# Patient Record
Sex: Male | Born: 1935 | Race: White | Hispanic: No | Marital: Married | Smoking: Former smoker
Health system: Southern US, Community
[De-identification: ages and names within clinical notes are randomized; demographics above are authoritative.]

## PROBLEM LIST (undated history)

## (undated) DIAGNOSIS — N138 Other obstructive and reflux uropathy: Secondary | ICD-10-CM

## (undated) DIAGNOSIS — G454 Transient global amnesia: Secondary | ICD-10-CM

## (undated) DIAGNOSIS — F09 Unspecified mental disorder due to known physiological condition: Secondary | ICD-10-CM

## (undated) DIAGNOSIS — G459 Transient cerebral ischemic attack, unspecified: Secondary | ICD-10-CM

## (undated) DIAGNOSIS — M48062 Spinal stenosis, lumbar region with neurogenic claudication: Secondary | ICD-10-CM

## (undated) DIAGNOSIS — R972 Elevated prostate specific antigen [PSA]: Secondary | ICD-10-CM

## (undated) DIAGNOSIS — I2510 Atherosclerotic heart disease of native coronary artery without angina pectoris: Secondary | ICD-10-CM

## (undated) DIAGNOSIS — C4492 Squamous cell carcinoma of skin, unspecified: Secondary | ICD-10-CM

## (undated) DIAGNOSIS — I10 Essential (primary) hypertension: Secondary | ICD-10-CM

## (undated) DIAGNOSIS — R351 Nocturia: Secondary | ICD-10-CM

## (undated) DIAGNOSIS — M48061 Spinal stenosis, lumbar region without neurogenic claudication: Secondary | ICD-10-CM

## (undated) DIAGNOSIS — C3491 Malignant neoplasm of unspecified part of right bronchus or lung: Secondary | ICD-10-CM

## (undated) DIAGNOSIS — N401 Enlarged prostate with lower urinary tract symptoms: Secondary | ICD-10-CM

## (undated) DIAGNOSIS — M1990 Unspecified osteoarthritis, unspecified site: Secondary | ICD-10-CM

## (undated) DIAGNOSIS — Z85118 Personal history of other malignant neoplasm of bronchus and lung: Secondary | ICD-10-CM

## (undated) DIAGNOSIS — E785 Hyperlipidemia, unspecified: Secondary | ICD-10-CM

## (undated) DIAGNOSIS — E7800 Pure hypercholesterolemia, unspecified: Secondary | ICD-10-CM

## (undated) DIAGNOSIS — N400 Enlarged prostate without lower urinary tract symptoms: Secondary | ICD-10-CM

## (undated) DIAGNOSIS — J300 Vasomotor rhinitis: Secondary | ICD-10-CM

## (undated) DIAGNOSIS — Z974 Presence of external hearing-aid: Secondary | ICD-10-CM

## (undated) HISTORY — DX: Unspecified mental disorder due to known physiological condition: F09

## (undated) HISTORY — DX: Other obstructive and reflux uropathy: N13.8

## (undated) HISTORY — PX: EPIDURAL BLOCK INJECTION: SHX1516

## (undated) HISTORY — DX: Transient cerebral ischemic attack, unspecified: G45.9

## (undated) HISTORY — PX: ROTATOR CUFF REPAIR: SHX139

## (undated) HISTORY — DX: Spinal stenosis, lumbar region with neurogenic claudication: M48.062

## (undated) HISTORY — DX: Spinal stenosis, lumbar region without neurogenic claudication: M48.061

## (undated) HISTORY — DX: Vasomotor rhinitis: J30.0

## (undated) HISTORY — DX: Transient global amnesia: G45.4

## (undated) HISTORY — DX: Benign prostatic hyperplasia with lower urinary tract symptoms: N40.1

## (undated) HISTORY — DX: Elevated prostate specific antigen (PSA): R97.20

## (undated) HISTORY — DX: Pure hypercholesterolemia, unspecified: E78.00

---
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
## 1999-10-14 DIAGNOSIS — Z85118 Personal history of other malignant neoplasm of bronchus and lung: Secondary | ICD-10-CM

## 1999-10-14 HISTORY — DX: Personal history of other malignant neoplasm of bronchus and lung: Z85.118

## 1999-10-14 HISTORY — PX: LUNG CANCER SURGERY: SHX702

## 2004-07-15 ENCOUNTER — Ambulatory Visit: Payer: Self-pay | Admitting: Oncology

## 2004-08-13 ENCOUNTER — Ambulatory Visit: Payer: Self-pay | Admitting: Oncology

## 2005-01-07 ENCOUNTER — Ambulatory Visit: Payer: Self-pay | Admitting: Oncology

## 2005-01-13 ENCOUNTER — Ambulatory Visit: Payer: Self-pay | Admitting: Oncology

## 2005-07-14 ENCOUNTER — Ambulatory Visit: Payer: Self-pay | Admitting: Oncology

## 2005-08-13 ENCOUNTER — Ambulatory Visit: Payer: Self-pay | Admitting: Oncology

## 2006-01-06 ENCOUNTER — Ambulatory Visit: Payer: Self-pay | Admitting: Oncology

## 2006-01-09 ENCOUNTER — Ambulatory Visit: Payer: Self-pay | Admitting: Oncology

## 2006-06-23 ENCOUNTER — Ambulatory Visit: Payer: Self-pay | Admitting: Oncology

## 2006-06-24 ENCOUNTER — Ambulatory Visit: Payer: Self-pay | Admitting: Oncology

## 2006-07-13 ENCOUNTER — Ambulatory Visit: Payer: Self-pay | Admitting: Oncology

## 2006-08-13 ENCOUNTER — Ambulatory Visit: Payer: Self-pay | Admitting: Oncology

## 2006-12-18 ENCOUNTER — Ambulatory Visit: Payer: Self-pay | Admitting: Internal Medicine

## 2006-12-29 ENCOUNTER — Ambulatory Visit: Payer: Self-pay | Admitting: Internal Medicine

## 2006-12-29 ENCOUNTER — Ambulatory Visit: Payer: Self-pay | Admitting: Oncology

## 2007-01-12 ENCOUNTER — Ambulatory Visit: Payer: Self-pay | Admitting: Internal Medicine

## 2007-06-09 ENCOUNTER — Ambulatory Visit: Payer: Self-pay | Admitting: Internal Medicine

## 2007-07-14 ENCOUNTER — Ambulatory Visit: Payer: Self-pay | Admitting: Internal Medicine

## 2007-07-22 ENCOUNTER — Ambulatory Visit: Payer: Self-pay | Admitting: Internal Medicine

## 2007-08-14 ENCOUNTER — Ambulatory Visit: Payer: Self-pay | Admitting: Internal Medicine

## 2007-10-14 ENCOUNTER — Ambulatory Visit: Payer: Self-pay | Admitting: Oncology

## 2007-12-12 ENCOUNTER — Ambulatory Visit: Payer: Self-pay | Admitting: Internal Medicine

## 2008-01-03 ENCOUNTER — Ambulatory Visit: Payer: Self-pay | Admitting: Internal Medicine

## 2008-01-12 ENCOUNTER — Ambulatory Visit: Payer: Self-pay | Admitting: Internal Medicine

## 2008-02-11 ENCOUNTER — Ambulatory Visit: Payer: Self-pay | Admitting: Internal Medicine

## 2008-04-12 ENCOUNTER — Ambulatory Visit: Payer: Self-pay | Admitting: Internal Medicine

## 2008-05-02 ENCOUNTER — Ambulatory Visit: Payer: Self-pay | Admitting: Internal Medicine

## 2008-05-04 ENCOUNTER — Ambulatory Visit: Payer: Self-pay | Admitting: Internal Medicine

## 2008-05-13 ENCOUNTER — Ambulatory Visit: Payer: Self-pay | Admitting: Internal Medicine

## 2008-07-13 ENCOUNTER — Ambulatory Visit: Payer: Self-pay | Admitting: Internal Medicine

## 2008-08-01 ENCOUNTER — Ambulatory Visit: Payer: Self-pay | Admitting: Internal Medicine

## 2008-08-03 ENCOUNTER — Ambulatory Visit: Payer: Self-pay | Admitting: Internal Medicine

## 2008-08-13 ENCOUNTER — Ambulatory Visit: Payer: Self-pay | Admitting: Internal Medicine

## 2008-10-13 ENCOUNTER — Ambulatory Visit: Payer: Self-pay | Admitting: Internal Medicine

## 2008-11-08 ENCOUNTER — Ambulatory Visit: Payer: Self-pay | Admitting: Internal Medicine

## 2008-11-10 ENCOUNTER — Ambulatory Visit: Payer: Self-pay | Admitting: Internal Medicine

## 2008-11-13 ENCOUNTER — Ambulatory Visit: Payer: Self-pay | Admitting: Internal Medicine

## 2009-02-10 ENCOUNTER — Ambulatory Visit: Payer: Self-pay | Admitting: Internal Medicine

## 2009-02-13 ENCOUNTER — Ambulatory Visit: Payer: Self-pay | Admitting: Internal Medicine

## 2009-02-15 ENCOUNTER — Ambulatory Visit: Payer: Self-pay | Admitting: Internal Medicine

## 2009-03-13 ENCOUNTER — Ambulatory Visit: Payer: Self-pay | Admitting: Internal Medicine

## 2009-06-27 ENCOUNTER — Ambulatory Visit: Payer: Self-pay | Admitting: Orthopedic Surgery

## 2009-07-11 ENCOUNTER — Ambulatory Visit: Payer: Self-pay | Admitting: Ophthalmology

## 2009-07-18 ENCOUNTER — Ambulatory Visit: Payer: Self-pay | Admitting: Ophthalmology

## 2009-08-27 ENCOUNTER — Ambulatory Visit: Payer: Self-pay | Admitting: Family Medicine

## 2009-08-29 ENCOUNTER — Ambulatory Visit: Payer: Self-pay | Admitting: Internal Medicine

## 2009-09-12 ENCOUNTER — Ambulatory Visit: Payer: Self-pay | Admitting: Internal Medicine

## 2010-02-10 ENCOUNTER — Ambulatory Visit: Payer: Self-pay | Admitting: Internal Medicine

## 2010-02-22 ENCOUNTER — Ambulatory Visit: Payer: Self-pay | Admitting: Internal Medicine

## 2010-02-26 ENCOUNTER — Ambulatory Visit: Payer: Self-pay | Admitting: Internal Medicine

## 2010-02-28 ENCOUNTER — Ambulatory Visit: Payer: Self-pay | Admitting: Otolaryngology

## 2010-03-13 ENCOUNTER — Ambulatory Visit: Payer: Self-pay | Admitting: Internal Medicine

## 2010-04-12 ENCOUNTER — Ambulatory Visit: Payer: Self-pay | Admitting: Internal Medicine

## 2010-08-26 ENCOUNTER — Ambulatory Visit: Payer: Self-pay | Admitting: Internal Medicine

## 2010-08-29 ENCOUNTER — Ambulatory Visit: Payer: Self-pay | Admitting: Internal Medicine

## 2010-09-12 ENCOUNTER — Ambulatory Visit: Payer: Self-pay | Admitting: Internal Medicine

## 2011-08-21 ENCOUNTER — Ambulatory Visit: Payer: Self-pay | Admitting: Internal Medicine

## 2011-08-29 ENCOUNTER — Ambulatory Visit: Payer: Self-pay | Admitting: Internal Medicine

## 2011-09-13 ENCOUNTER — Ambulatory Visit: Payer: Self-pay | Admitting: Internal Medicine

## 2011-09-22 ENCOUNTER — Ambulatory Visit: Payer: Self-pay | Admitting: Internal Medicine

## 2012-02-25 ENCOUNTER — Ambulatory Visit: Payer: Self-pay | Admitting: Internal Medicine

## 2012-03-01 ENCOUNTER — Ambulatory Visit: Payer: Self-pay | Admitting: Oncology

## 2012-03-01 LAB — COMPREHENSIVE METABOLIC PANEL
Anion Gap: 10 (ref 7–16)
BUN: 17 mg/dL (ref 7–18)
Chloride: 100 mmol/L (ref 98–107)
Co2: 30 mmol/L (ref 21–32)
Creatinine: 1.12 mg/dL (ref 0.60–1.30)
EGFR (African American): >60
EGFR (Non-African Amer.): >60
Osmolality: 282 (ref 275–301)

## 2012-03-01 LAB — CBC CANCER CENTER
HCT: 45.4 % (ref 40.0–52.0)
HGB: 14.9 g/dL (ref 13.0–18.0)
Lymphocyte %: 24 %
MCH: 28.2 pg (ref 26.0–34.0)
MCHC: 32.9 g/dL (ref 32.0–36.0)
MCV: 86 fL (ref 80–100)
Monocyte %: 11.9 %
Platelet: 213 x10 3/mm (ref 150–440)
RBC: 5.3 10*6/uL (ref 4.40–5.90)
RDW: 15.3 % — ABNORMAL HIGH (ref 11.5–14.5)
WBC: 7.4 x10 3/mm (ref 3.8–10.6)

## 2012-03-13 ENCOUNTER — Ambulatory Visit: Payer: Self-pay | Admitting: Oncology

## 2012-07-05 DIAGNOSIS — R972 Elevated prostate specific antigen [PSA]: Secondary | ICD-10-CM | POA: Insufficient documentation

## 2012-07-05 HISTORY — DX: Elevated prostate specific antigen (PSA): R97.20

## 2012-08-25 ENCOUNTER — Ambulatory Visit: Payer: Self-pay | Admitting: Oncology

## 2012-09-12 ENCOUNTER — Ambulatory Visit: Payer: Self-pay | Admitting: Oncology

## 2012-10-08 ENCOUNTER — Ambulatory Visit: Payer: Self-pay | Admitting: Internal Medicine

## 2012-11-06 DIAGNOSIS — N138 Other obstructive and reflux uropathy: Secondary | ICD-10-CM

## 2012-11-06 DIAGNOSIS — N401 Enlarged prostate with lower urinary tract symptoms: Secondary | ICD-10-CM | POA: Insufficient documentation

## 2012-11-06 HISTORY — DX: Other obstructive and reflux uropathy: N13.8

## 2013-01-05 ENCOUNTER — Other Ambulatory Visit: Payer: Self-pay | Admitting: Neurosurgery

## 2013-01-11 ENCOUNTER — Encounter (HOSPITAL_COMMUNITY): Payer: Self-pay | Admitting: Pharmacy Technician

## 2013-01-12 NOTE — Pre-Procedure Instructions (Signed)
Roberto Adams  01/12/2013   Your procedure is scheduled on:  Monday, April 7  Report to [facility] at 0700 AM.  Call this number if you have problems the morning of surgery: [phone]   Remember:   Do not eat food or drink liquids after midnight.Sunday night   Take these medicines the morning of surgery with A SIP OF WATER: none   Do not wear jewelry, make-up or nail polish.  Do not wear lotions, powders, or perfumes. You may wear deodorant.  Do not shave 48 hours prior to surgery. Men may shave face and neck.  Do not bring valuables to the hospital.  Contacts, dentures or bridgework may not be worn into surgery.  Leave suitcase in the car. After surgery it may be brought to your room.  For patients admitted to the hospital, checkout time is 11:00 AM the day of  discharge.   Special Instructions: Shower using CHG 2 nights before surgery and the night before surgery.  If you shower the day of surgery use CHG.  Use special wash - you have one bottle of CHG for all showers.  You should use approximately 1/3 of the bottle for each shower.   Please read over the following fact sheets that you were given: Pain Booklet, Coughing and Deep Breathing and Surgical Site Infection Prevention

## 2013-01-13 ENCOUNTER — Encounter (HOSPITAL_COMMUNITY): Payer: Self-pay

## 2013-01-13 ENCOUNTER — Encounter (HOSPITAL_COMMUNITY)
Admission: RE | Admit: 2013-01-13 | Discharge: 2013-01-13 | Disposition: A | Discharge status: Home / Self Care | Payer: Medicare Other | Source: Ambulatory Visit | Attending: Neurosurgery | Admitting: Neurosurgery

## 2013-01-13 ENCOUNTER — Ambulatory Visit (HOSPITAL_COMMUNITY)
Admission: RE | Admit: 2013-01-13 | Discharge: 2013-01-13 | Disposition: A | Discharge status: Home / Self Care | Payer: Medicare Other | Source: Ambulatory Visit | Attending: Anesthesiology | Admitting: Anesthesiology

## 2013-01-13 DIAGNOSIS — Z01810 Encounter for preprocedural cardiovascular examination: Secondary | ICD-10-CM | POA: Insufficient documentation

## 2013-01-13 DIAGNOSIS — Z85118 Personal history of other malignant neoplasm of bronchus and lung: Secondary | ICD-10-CM | POA: Insufficient documentation

## 2013-01-13 DIAGNOSIS — I10 Essential (primary) hypertension: Secondary | ICD-10-CM | POA: Insufficient documentation

## 2013-01-13 DIAGNOSIS — Z01812 Encounter for preprocedural laboratory examination: Secondary | ICD-10-CM | POA: Insufficient documentation

## 2013-01-13 DIAGNOSIS — Z01818 Encounter for other preprocedural examination: Secondary | ICD-10-CM | POA: Insufficient documentation

## 2013-01-13 DIAGNOSIS — N400 Enlarged prostate without lower urinary tract symptoms: Secondary | ICD-10-CM | POA: Insufficient documentation

## 2013-01-13 DIAGNOSIS — Z87891 Personal history of nicotine dependence: Secondary | ICD-10-CM | POA: Insufficient documentation

## 2013-01-13 DIAGNOSIS — E669 Obesity, unspecified: Secondary | ICD-10-CM | POA: Insufficient documentation

## 2013-01-13 HISTORY — DX: Nocturia: R35.1

## 2013-01-13 HISTORY — DX: Benign prostatic hyperplasia with lower urinary tract symptoms: N40.1

## 2013-01-13 HISTORY — DX: Personal history of other malignant neoplasm of bronchus and lung: Z85.118

## 2013-01-13 HISTORY — DX: Essential (primary) hypertension: I10

## 2013-01-13 HISTORY — DX: Unspecified osteoarthritis, unspecified site: M19.90

## 2013-01-13 HISTORY — DX: Presence of external hearing-aid: Z97.4

## 2013-01-13 HISTORY — DX: Benign prostatic hyperplasia without lower urinary tract symptoms: N40.0

## 2013-01-13 LAB — BASIC METABOLIC PANEL
CO2: 27 mEq/L (ref 19–32)
Calcium: 9.6 mg/dL (ref 8.4–10.5)
Chloride: 100 mEq/L (ref 96–112)
Glucose, Bld: 101 mg/dL — ABNORMAL HIGH (ref 70–99)
Sodium: 137 mEq/L (ref 135–145)

## 2013-01-13 LAB — CBC WITH DIFFERENTIAL/PLATELET
Eosinophils Absolute: 0.2 10*3/uL (ref 0.0–0.7)
Eosinophils Relative: 2 % (ref 0–5)
HCT: 44.2 % (ref 39.0–52.0)
Lymphocytes Relative: 24 % (ref 12–46)
Lymphs Abs: 2.1 10*3/uL (ref 0.7–4.0)
MCH: 28.8 pg (ref 26.0–34.0)
MCV: 82.6 fL (ref 78.0–100.0)
Monocytes Absolute: 0.7 10*3/uL (ref 0.1–1.0)
Monocytes Relative: 9 % (ref 3–12)
RBC: 5.35 MIL/uL (ref 4.22–5.81)
WBC: 8.6 10*3/uL (ref 4.0–10.5)

## 2013-01-13 LAB — SURGICAL PCR SCREEN: Staphylococcus aureus: NEGATIVE

## 2013-01-13 NOTE — Progress Notes (Signed)
Stop-Bang tool faxed to PCP ; pt score 4

## 2013-01-13 NOTE — Progress Notes (Signed)
EKG requested from PCP, Dr Dareen Piano @ [facility] in [place]

## 2013-01-14 NOTE — Progress Notes (Addendum)
Anesthesia Chart Review:  Patient is a 78 year old male scheduled for right L2-3, L3-4, L4-5 decompressive lumbar laminectomy on 01/17/13 by Dr. Jordan Likes.  History includes obesity, former smoker, hypertension, BPH, hearing aids, RUL lung cancer s/p surgical resection and chemoradiation '01. His OSA screening score was 4 or greater. PCP is Dr. Dareen Piano at [facility].  EKG on 01/13/13 showed NSR, right BBB.  It appears stable when compared to his EKG on 07/11/09 from his PCP.  CXR report on 01/13/13 showed: Cardiomediastinal silhouette is unremarkable. Mild elevation of the right hemidiaphragm. Degenerative changes lower thoracic spine. No acute infiltrate or pulmonary edema. Old fracture of the right clavicle. Postsurgical changes left humeral head.   Preoperative labs noted.  He will be evaluated by his assigned anesthesiologist on the day of surgery to discuss the definitive anesthesia plan.  Velna Ochs [facility]/Anesthesiology Phone [phone] 01/14/2013 9:55 AM

## 2013-01-16 MED ORDER — CEFAZOLIN SODIUM-DEXTROSE 2-3 GM-% IV SOLR
2.0000 g | INTRAVENOUS | Status: AC
  Administered 2013-01-17: 2 g via INTRAVENOUS
  Filled 2013-01-16: qty 50

## 2013-01-16 MED ORDER — DEXAMETHASONE SODIUM PHOSPHATE 10 MG/ML IJ SOLN
10.0000 mg | INTRAMUSCULAR | Status: AC
  Administered 2013-01-17: 10 mg via INTRAVENOUS
  Filled 2013-01-16: qty 1

## 2013-01-17 ENCOUNTER — Inpatient Hospital Stay (HOSPITAL_COMMUNITY): Payer: Medicare Other | Admitting: Anesthesiology

## 2013-01-17 ENCOUNTER — Observation Stay (HOSPITAL_COMMUNITY)
Admission: RE | Admit: 2013-01-17 | Discharge: 2013-01-18 | Disposition: A | Discharge status: Home / Self Care | Payer: Medicare Other | Source: Ambulatory Visit | Attending: Neurosurgery | Admitting: Neurosurgery

## 2013-01-17 ENCOUNTER — Inpatient Hospital Stay (HOSPITAL_COMMUNITY): Payer: Medicare Other

## 2013-01-17 ENCOUNTER — Encounter (HOSPITAL_COMMUNITY)
Admission: RE | Disposition: A | Discharge status: Home / Self Care | Payer: Self-pay | Source: Ambulatory Visit | Attending: Neurosurgery

## 2013-01-17 ENCOUNTER — Encounter (HOSPITAL_COMMUNITY): Payer: Self-pay | Admitting: Surgery

## 2013-01-17 ENCOUNTER — Encounter (HOSPITAL_COMMUNITY): Payer: Self-pay | Admitting: Vascular Surgery

## 2013-01-17 DIAGNOSIS — M48062 Spinal stenosis, lumbar region with neurogenic claudication: Secondary | ICD-10-CM

## 2013-01-17 DIAGNOSIS — M47817 Spondylosis without myelopathy or radiculopathy, lumbosacral region: Principal | ICD-10-CM | POA: Insufficient documentation

## 2013-01-17 DIAGNOSIS — M48061 Spinal stenosis, lumbar region without neurogenic claudication: Secondary | ICD-10-CM | POA: Insufficient documentation

## 2013-01-17 DIAGNOSIS — I10 Essential (primary) hypertension: Secondary | ICD-10-CM | POA: Insufficient documentation

## 2013-01-17 HISTORY — PX: LUMBAR LAMINECTOMY/DECOMPRESSION MICRODISCECTOMY: SHX5026

## 2013-01-17 HISTORY — DX: Spinal stenosis, lumbar region without neurogenic claudication: M48.061

## 2013-01-17 HISTORY — DX: Spinal stenosis, lumbar region with neurogenic claudication: M48.062

## 2013-01-17 SURGERY — LUMBAR LAMINECTOMY/DECOMPRESSION MICRODISCECTOMY 2 LEVELS
Anesthesia: General | Site: Spine Lumbar | Laterality: Right | Wound class: Clean

## 2013-01-17 MED ORDER — ONDANSETRON HCL 4 MG/2ML IJ SOLN: 4.0000 mg | Freq: Once | INTRAMUSCULAR | Status: DC | PRN

## 2013-01-17 MED ORDER — 0.9 % SODIUM CHLORIDE (POUR BTL) OPTIME
TOPICAL | Status: DC | PRN
  Administered 2013-01-17: 1000 mL

## 2013-01-17 MED ORDER — HYDROCODONE-ACETAMINOPHEN 5-325 MG PO TABS: 1.0000 | ORAL_TABLET | ORAL | Status: DC | PRN

## 2013-01-17 MED ORDER — EPHEDRINE SULFATE 50 MG/ML IJ SOLN
INTRAMUSCULAR | Status: DC | PRN
  Administered 2013-01-17 (×2): 5 mg via INTRAVENOUS

## 2013-01-17 MED ORDER — HYDROMORPHONE HCL PF 1 MG/ML IJ SOLN
INTRAMUSCULAR | Status: AC
  Filled 2013-01-17: qty 1

## 2013-01-17 MED ORDER — HEMOSTATIC AGENTS (NO CHARGE) OPTIME
TOPICAL | Status: DC | PRN
  Administered 2013-01-17: 1 via TOPICAL

## 2013-01-17 MED ORDER — ASPIRIN EC 81 MG PO TBEC
81.0000 mg | DELAYED_RELEASE_TABLET | Freq: Every day | ORAL | Status: DC
  Administered 2013-01-17: 81 mg via ORAL
  Filled 2013-01-17 (×2): qty 1

## 2013-01-17 MED ORDER — BACITRACIN 50000 UNITS IM SOLR
INTRAMUSCULAR | Status: AC
  Filled 2013-01-17: qty 1

## 2013-01-17 MED ORDER — HYDROCHLOROTHIAZIDE 25 MG PO TABS
25.0000 mg | ORAL_TABLET | Freq: Every day | ORAL | Status: DC
  Filled 2013-01-17: qty 1

## 2013-01-17 MED ORDER — CEFAZOLIN SODIUM 1-5 GM-% IV SOLN
1.0000 g | Freq: Three times a day (TID) | INTRAVENOUS | Status: AC
  Administered 2013-01-17 – 2013-01-18 (×2): 1 g via INTRAVENOUS
  Filled 2013-01-17 (×2): qty 50

## 2013-01-17 MED ORDER — PNEUMOCOCCAL VAC POLYVALENT 25 MCG/0.5ML IJ INJ
0.5000 mL | INJECTION | INTRAMUSCULAR | Status: DC
Start: 2013-01-18 — End: 2013-01-18
  Filled 2013-01-17: qty 0.5

## 2013-01-17 MED ORDER — SENNA 8.6 MG PO TABS
1.0000 | ORAL_TABLET | Freq: Two times a day (BID) | ORAL | Status: DC
  Administered 2013-01-17: 8.6 mg via ORAL
  Filled 2013-01-17 (×3): qty 1

## 2013-01-17 MED ORDER — ADULT MULTIVITAMIN W/MINERALS CH
1.0000 | ORAL_TABLET | Freq: Every day | ORAL | Status: DC
  Filled 2013-01-17: qty 1

## 2013-01-17 MED ORDER — ALUM & MAG HYDROXIDE-SIMETH 200-200-20 MG/5ML PO SUSP: 30.0000 mL | Freq: Four times a day (QID) | ORAL | Status: DC | PRN

## 2013-01-17 MED ORDER — SODIUM CHLORIDE 0.9 % IJ SOLN: 3.0000 mL | INTRAMUSCULAR | Status: DC | PRN

## 2013-01-17 MED ORDER — ONDANSETRON HCL 4 MG/2ML IJ SOLN: 4.0000 mg | INTRAMUSCULAR | Status: DC | PRN

## 2013-01-17 MED ORDER — NEOSTIGMINE METHYLSULFATE 1 MG/ML IJ SOLN
INTRAMUSCULAR | Status: DC | PRN
  Administered 2013-01-17: 3 mg via INTRAVENOUS

## 2013-01-17 MED ORDER — ONDANSETRON HCL 4 MG/2ML IJ SOLN
INTRAMUSCULAR | Status: DC | PRN
  Administered 2013-01-17: 4 mg via INTRAVENOUS

## 2013-01-17 MED ORDER — SODIUM CHLORIDE 0.9 % IJ SOLN
3.0000 mL | Freq: Two times a day (BID) | INTRAMUSCULAR | Status: DC
  Administered 2013-01-17: 3 mL via INTRAVENOUS

## 2013-01-17 MED ORDER — ROCURONIUM BROMIDE 100 MG/10ML IV SOLN
INTRAVENOUS | Status: DC | PRN
  Administered 2013-01-17: 50 mg via INTRAVENOUS

## 2013-01-17 MED ORDER — PROPOFOL 10 MG/ML IV BOLUS
INTRAVENOUS | Status: DC | PRN
  Administered 2013-01-17: 20 mg via INTRAVENOUS

## 2013-01-17 MED ORDER — MIDAZOLAM HCL 5 MG/5ML IJ SOLN
INTRAMUSCULAR | Status: DC | PRN
  Administered 2013-01-17: 2 mg via INTRAVENOUS

## 2013-01-17 MED ORDER — GLYCOPYRROLATE 0.2 MG/ML IJ SOLN
INTRAMUSCULAR | Status: DC | PRN
  Administered 2013-01-17: .5 mg via INTRAVENOUS

## 2013-01-17 MED ORDER — HYDROMORPHONE HCL PF 1 MG/ML IJ SOLN: 0.2500 mg | INTRAMUSCULAR | Status: DC | PRN

## 2013-01-17 MED ORDER — HYDROMORPHONE HCL PF 1 MG/ML IJ SOLN
0.5000 mg | INTRAMUSCULAR | Status: DC | PRN
  Administered 2013-01-17: 1 mg via INTRAVENOUS

## 2013-01-17 MED ORDER — BUPIVACAINE HCL (PF) 0.25 % IJ SOLN
INTRAMUSCULAR | Status: DC | PRN
  Administered 2013-01-17: 10 mL

## 2013-01-17 MED ORDER — KETOROLAC TROMETHAMINE 30 MG/ML IJ SOLN
30.0000 mg | Freq: Four times a day (QID) | INTRAMUSCULAR | Status: DC
  Administered 2013-01-17 – 2013-01-18 (×3): 30 mg via INTRAVENOUS
  Filled 2013-01-17 (×7): qty 1

## 2013-01-17 MED ORDER — OXYCODONE-ACETAMINOPHEN 5-325 MG PO TABS: 1.0000 | ORAL_TABLET | ORAL | Status: DC | PRN

## 2013-01-17 MED ORDER — SODIUM CHLORIDE 0.9 % IV SOLN: 250.0000 mL | INTRAVENOUS | Status: DC

## 2013-01-17 MED ORDER — FENTANYL CITRATE 0.05 MG/ML IJ SOLN
INTRAMUSCULAR | Status: DC | PRN
  Administered 2013-01-17: 150 ug via INTRAVENOUS
  Administered 2013-01-17: 50 ug via INTRAVENOUS

## 2013-01-17 MED ORDER — THROMBIN 5000 UNITS EX SOLR
CUTANEOUS | Status: DC | PRN
  Administered 2013-01-17 (×2): 5000 [IU] via TOPICAL

## 2013-01-17 MED ORDER — ACETAMINOPHEN 650 MG RE SUPP: 650.0000 mg | RECTAL | Status: DC | PRN

## 2013-01-17 MED ORDER — PHENOL 1.4 % MT LIQD: 1.0000 | OROMUCOSAL | Status: DC | PRN

## 2013-01-17 MED ORDER — LIDOCAINE HCL (CARDIAC) 20 MG/ML IV SOLN
INTRAVENOUS | Status: DC | PRN
  Administered 2013-01-17: 100 mg via INTRAVENOUS

## 2013-01-17 MED ORDER — KETOROLAC TROMETHAMINE 30 MG/ML IJ SOLN
30.0000 mg | Freq: Once | INTRAMUSCULAR | Status: AC
  Administered 2013-01-17: 30 mg via INTRAVENOUS

## 2013-01-17 MED ORDER — LISINOPRIL 40 MG PO TABS
40.0000 mg | ORAL_TABLET | Freq: Every day | ORAL | Status: DC
  Filled 2013-01-17: qty 1

## 2013-01-17 MED ORDER — LACTATED RINGERS IV SOLN
INTRAVENOUS | Status: DC | PRN
  Administered 2013-01-17 (×3): via INTRAVENOUS

## 2013-01-17 MED ORDER — ACETAMINOPHEN 325 MG PO TABS: 650.0000 mg | ORAL_TABLET | ORAL | Status: DC | PRN

## 2013-01-17 MED ORDER — SODIUM CHLORIDE 0.9 % IR SOLN
Status: DC | PRN
  Administered 2013-01-17: 12:00:00

## 2013-01-17 MED ORDER — SODIUM CHLORIDE 0.9 % IV SOLN
INTRAVENOUS | Status: AC
  Filled 2013-01-17: qty 500

## 2013-01-17 MED ORDER — PHENYLEPHRINE HCL 10 MG/ML IJ SOLN
INTRAMUSCULAR | Status: DC | PRN
  Administered 2013-01-17: 40 ug via INTRAVENOUS
  Administered 2013-01-17: 80 ug via INTRAVENOUS
  Administered 2013-01-17: 40 ug via INTRAVENOUS
  Administered 2013-01-17 (×2): 80 ug via INTRAVENOUS

## 2013-01-17 MED ORDER — MENTHOL 3 MG MT LOZG: 1.0000 | LOZENGE | OROMUCOSAL | Status: DC | PRN

## 2013-01-17 MED ORDER — ASPIRIN 81 MG PO TABS: 81.0000 mg | ORAL_TABLET | Freq: Every day | ORAL | Status: DC

## 2013-01-17 MED ORDER — ZOLPIDEM TARTRATE 5 MG PO TABS: 5.0000 mg | ORAL_TABLET | Freq: Every evening | ORAL | Status: DC | PRN

## 2013-01-17 MED ORDER — CYCLOBENZAPRINE HCL 10 MG PO TABS: 10.0000 mg | ORAL_TABLET | Freq: Three times a day (TID) | ORAL | Status: DC | PRN

## 2013-01-17 SURGICAL SUPPLY — 60 items
BAG DECANTER FOR FLEXI CONT (MISCELLANEOUS) ×2 IMPLANT
BENZOIN TINCTURE PRP APPL 2/3 (GAUZE/BANDAGES/DRESSINGS) ×2 IMPLANT
BLADE SURG ROTATE 9660 (MISCELLANEOUS) ×2 IMPLANT
BRUSH SCRUB EZ PLAIN DRY (MISCELLANEOUS) ×2 IMPLANT
BUR CUTTER 7.0 ROUND (BURR) ×2 IMPLANT
CANISTER SUCTION 2500CC (MISCELLANEOUS) ×2 IMPLANT
CLOTH BEACON ORANGE TIMEOUT ST (SAFETY) ×2 IMPLANT
CONT SPEC 4OZ CLIKSEAL STRL BL (MISCELLANEOUS) ×2 IMPLANT
DECANTER SPIKE VIAL GLASS SM (MISCELLANEOUS) IMPLANT
DERMABOND ADHESIVE PROPEN (GAUZE/BANDAGES/DRESSINGS) ×1
DERMABOND ADVANCED (GAUZE/BANDAGES/DRESSINGS) ×1
DERMABOND ADVANCED .7 DNX12 (GAUZE/BANDAGES/DRESSINGS) ×1 IMPLANT
DERMABOND ADVANCED .7 DNX6 (GAUZE/BANDAGES/DRESSINGS) ×1 IMPLANT
DRAPE LAPAROTOMY 100X72X124 (DRAPES) ×2 IMPLANT
DRAPE MICROSCOPE ZEISS OPMI (DRAPES) IMPLANT
DRAPE POUCH INSTRU U-SHP 10X18 (DRAPES) ×2 IMPLANT
DRAPE PROXIMA HALF (DRAPES) IMPLANT
DRAPE SURG 17X23 STRL (DRAPES) ×4 IMPLANT
DURAPREP 26ML APPLICATOR (WOUND CARE) ×2 IMPLANT
ELECT REM PT RETURN 9FT ADLT (ELECTROSURGICAL) ×2
ELECTRODE REM PT RTRN 9FT ADLT (ELECTROSURGICAL) ×1 IMPLANT
EVACUATOR 1/8 PVC DRAIN (DRAIN) ×2 IMPLANT
GAUZE SPONGE 4X4 16PLY XRAY LF (GAUZE/BANDAGES/DRESSINGS) IMPLANT
GLOVE BIO SURGEON STRL SZ8.5 (GLOVE) ×2 IMPLANT
GLOVE BIOGEL PI IND STRL 7.0 (GLOVE) ×2 IMPLANT
GLOVE BIOGEL PI IND STRL 7.5 (GLOVE) ×1 IMPLANT
GLOVE BIOGEL PI INDICATOR 7.0 (GLOVE) ×2
GLOVE BIOGEL PI INDICATOR 7.5 (GLOVE) ×1
GLOVE ECLIPSE 7.5 STRL STRAW (GLOVE) ×4 IMPLANT
GLOVE ECLIPSE 8.5 STRL (GLOVE) ×2 IMPLANT
GLOVE EXAM NITRILE LRG STRL (GLOVE) IMPLANT
GLOVE EXAM NITRILE MD LF STRL (GLOVE) ×2 IMPLANT
GLOVE EXAM NITRILE XL STR (GLOVE) IMPLANT
GLOVE EXAM NITRILE XS STR PU (GLOVE) IMPLANT
GLOVE SS BIOGEL STRL SZ 6.5 (GLOVE) ×2 IMPLANT
GLOVE SS BIOGEL STRL SZ 8 (GLOVE) ×1 IMPLANT
GLOVE SUPERSENSE BIOGEL SZ 6.5 (GLOVE) ×2
GLOVE SUPERSENSE BIOGEL SZ 8 (GLOVE) ×1
GOWN BRE IMP SLV AUR LG STRL (GOWN DISPOSABLE) ×2 IMPLANT
GOWN BRE IMP SLV AUR XL STRL (GOWN DISPOSABLE) ×6 IMPLANT
GOWN STRL REIN 2XL LVL4 (GOWN DISPOSABLE) IMPLANT
KIT BASIN OR (CUSTOM PROCEDURE TRAY) ×2 IMPLANT
KIT ROOM TURNOVER OR (KITS) ×2 IMPLANT
NEEDLE HYPO 22GX1.5 SAFETY (NEEDLE) ×2 IMPLANT
NEEDLE SPNL 22GX3.5 QUINCKE BK (NEEDLE) ×2 IMPLANT
NS IRRIG 1000ML POUR BTL (IV SOLUTION) ×2 IMPLANT
PACK LAMINECTOMY NEURO (CUSTOM PROCEDURE TRAY) ×2 IMPLANT
PAD ARMBOARD 7.5X6 YLW CONV (MISCELLANEOUS) ×10 IMPLANT
RUBBERBAND STERILE (MISCELLANEOUS) ×4 IMPLANT
SPONGE GAUZE 4X4 12PLY (GAUZE/BANDAGES/DRESSINGS) ×2 IMPLANT
SPONGE SURGIFOAM ABS GEL SZ50 (HEMOSTASIS) ×2 IMPLANT
STRIP CLOSURE SKIN 1/2X4 (GAUZE/BANDAGES/DRESSINGS) ×2 IMPLANT
SUT VIC AB 2-0 CT1 18 (SUTURE) ×2 IMPLANT
SUT VIC AB 3-0 SH 8-18 (SUTURE) ×2 IMPLANT
SYR 20ML ECCENTRIC (SYRINGE) ×2 IMPLANT
TAPE CLOTH SURG 4X10 WHT LF (GAUZE/BANDAGES/DRESSINGS) ×2 IMPLANT
TAPE STRIPS DRAPE STRL (GAUZE/BANDAGES/DRESSINGS) ×2 IMPLANT
TOWEL OR 17X24 6PK STRL BLUE (TOWEL DISPOSABLE) ×2 IMPLANT
TOWEL OR 17X26 10 PK STRL BLUE (TOWEL DISPOSABLE) ×2 IMPLANT
WATER STERILE IRR 1000ML POUR (IV SOLUTION) ×2 IMPLANT

## 2013-01-17 NOTE — Anesthesia Preprocedure Evaluation (Signed)
Anesthesia Evaluation  Patient identified by MRN, date of birth, ID band Patient awake    Reviewed: Allergy & Precautions, H&P , NPO status , Patient's Chart, lab work & pertinent test results  Airway Mallampati: I TM Distance: >3 FB Neck ROM: full    Dental   Pulmonary former smoker,          Cardiovascular hypertension, Pt. on medications Rhythm:regular Rate:Normal     Neuro/Psych    GI/Hepatic   Endo/Other    Renal/GU      Musculoskeletal   Abdominal   Peds  Hematology   Anesthesia Other Findings Hx Ca Lung  Rx surgery/chemo/radiation- HOH  Reproductive/Obstetrics                           Anesthesia Physical Anesthesia Plan  ASA: II  Anesthesia Plan: General   Post-op Pain Management:    Induction: Intravenous  Airway Management Planned: Oral ETT  Additional Equipment:   Intra-op Plan:   Post-operative Plan: Extubation in OR  Informed Consent: I have reviewed the patients History and Physical, chart, labs and discussed the procedure including the risks, benefits and alternatives for the proposed anesthesia with the patient or authorized representative who has indicated his/her understanding and acceptance.     Plan Discussed with: CRNA, Anesthesiologist and Surgeon  Anesthesia Plan Comments:         Anesthesia Quick Evaluation

## 2013-01-17 NOTE — Preoperative (Signed)
Beta Blockers   Reason not to administer Beta Blockers:Not Applicable 

## 2013-01-17 NOTE — Transfer of Care (Signed)
Immediate Anesthesia Transfer of Care Note  Patient: Roberto Adams  Procedure(s) Performed: Procedure(s) with comments: Right Lumbar Two-three,Lumbar three-four,Lumbar Four-Five Decompressive Lumbar laminectomy (Right) - right  Patient Location: PACU  Anesthesia Type:General  Level of Consciousness: awake, alert  and oriented  Airway & Oxygen Therapy: Patient Spontanous Breathing and Patient connected to nasal cannula oxygen  Post-op Assessment: Report given to PACU RN, Post -op Vital signs reviewed and stable and Patient moving all extremities  Post vital signs: Reviewed and stable  Complications: No apparent anesthesia complications

## 2013-01-17 NOTE — Brief Op Note (Signed)
01/17/2013  12:40 PM  PATIENT:  Roberto Adams  78 y.o. male  PRE-OPERATIVE DIAGNOSIS:  stenosis/spondylosis  POST-OPERATIVE DIAGNOSIS:  stenosis/Spondylosis  PROCEDURE:  Procedure(s) with comments: Right Lumbar Two-three,Lumbar three-four,Lumbar Four-Five Decompressive Lumbar laminectomy (Right) - right  SURGEON:  Surgeon(s) and Role:    * Temple Pacini, MD - Primary    * Cristi Loron, MD - Assisting  PHYSICIAN ASSISTANT:   ASSISTANTS:    ANESTHESIA:   general  EBL:  Total I/O In: 2000 [I.V.:2000] Out: -   BLOOD ADMINISTERED:none  DRAINS: (Medium) Hemovact drain(s) in the Epidural space with  Suction Open   LOCAL MEDICATIONS USED:  MARCAINE     SPECIMEN:  No Specimen  DISPOSITION OF SPECIMEN:  N/A  COUNTS:  YES  TOURNIQUET:  * No tourniquets in log *  DICTATION: .Dragon Dictation  PLAN OF CARE: Admit for overnight observation  PATIENT DISPOSITION:  PACU - hemodynamically stable.   Delay start of Pharmacological VTE agent (>24hrs) due to surgical blood loss or risk of bleeding: yes

## 2013-01-17 NOTE — Anesthesia Postprocedure Evaluation (Signed)
  Anesthesia Post-op Note  Patient: Roberto Adams  Procedure(s) Performed: Procedure(s) with comments: Right Lumbar Two-three,Lumbar three-four,Lumbar Four-Five Decompressive Lumbar laminectomy (Right) - right  Patient Location: PACU  Anesthesia Type:General  Level of Consciousness: awake, oriented and patient cooperative  Airway and Oxygen Therapy: Patient Spontanous Breathing  Post-op Pain: mild  Post-op Assessment: Post-op Vital signs reviewed, Patient's Cardiovascular Status Stable, Respiratory Function Stable, Patent Airway, No signs of Nausea or vomiting and Pain level controlled  Post-op Vital Signs: stable  Complications: No apparent anesthesia complications

## 2013-01-17 NOTE — H&P (Signed)
Roberto Adams is an 78 y.o. male.   Chief Complaint: Right leg pain HPI: 78 year old male with right anterior anterior lateral thigh and leg pain failing conservative management. Symptoms consistent with neurogenic claudication. Workup demonstrates evidence of multilevel spondylosis with stenosis worse on the right side. Patient presents now for three-level right-sided decompressive laminotomy and foraminotomies in hopes of improving his symptoms.  Past Medical History  Diagnosis Date  . Hypertension   . Cancer   . History of lung cancer 2001    Rt upper lobe; s/p chemo and radiation with recurrence  . Enlarged prostate   . Nocturia associated with benign prostatic hypertrophy   . Arthritis   . Hearing aid worn     both ears    Past Surgical History  Procedure Laterality Date  . Pilonidal cyst / sinus excision  1958  . Rotator cuff repair  [id number]  . Lung cancer surgery Right 2001  . Epidural block injection      History reviewed. No pertinent family history. Social History:  reports that he has quit smoking. His smoking use included Cigarettes. He has a 35 pack-year smoking history. He does not have any smokeless tobacco history on file. He reports that he does not drink alcohol or use illicit drugs.  Allergies: No Known Allergies  Medications Prior to Admission  Medication Sig Dispense Refill  . aspirin 81 MG tablet Take 81 mg by mouth daily.      . fish oil-omega-3 fatty acids 1000 MG capsule Take 1 g by mouth daily. Stopped for surgery      . Garlic 100 MG TABS Take by mouth. Stopped for surgery      . hydrochlorothiazide (HYDRODIURIL) 25 MG tablet Take 25 mg by mouth daily.      Marland Kitchen lisinopril (PRINIVIL,ZESTRIL) 40 MG tablet Take 40 mg by mouth daily.      . Multiple Vitamin (MULTIVITAMIN WITH MINERALS) TABS Take 1 tablet by mouth daily.      . Saw Palmetto, Serenoa repens, (SAW PALMETTO PO) Take 1 tablet by mouth daily. Stopped for surgery        No results found for  this or any previous visit (from the past 48 hour(s)). No results found.  Review of Systems  Constitutional: Negative.   HENT: Negative.   Eyes: Negative.   Respiratory: Negative.   Cardiovascular: Negative.   Gastrointestinal: Negative.   Genitourinary: Negative.   Musculoskeletal: Negative.   Skin: Negative.   Neurological: Negative.   Endo/Heme/Allergies: Negative.   Psychiatric/Behavioral: Negative.     Blood pressure 136/79, pulse 86, temperature 98.1 F (36.7 C), temperature source Oral, resp. rate 18, SpO2 97.00%. Physical Exam  Constitutional: He is oriented to person, place, and time. He appears well-developed and well-nourished. No distress.  HENT:  Head: Normocephalic and atraumatic.  Right Ear: External ear normal.  Left Ear: External ear normal.  Nose: Nose normal.  Mouth/Throat: Oropharynx is clear and moist. No oropharyngeal exudate.  Eyes: Conjunctivae and EOM are normal. Pupils are equal, round, and reactive to light. Right eye exhibits no discharge. Left eye exhibits no discharge.  Neck: Normal range of motion. Neck supple. No tracheal deviation present. No thyromegaly present.  Cardiovascular: Normal rate, regular rhythm, normal heart sounds and intact distal pulses.  Exam reveals no friction rub.   No murmur heard. Respiratory: Effort normal and breath sounds normal. No respiratory distress. He has no wheezes.  GI: Soft. Bowel sounds are normal. He exhibits no distension. There is no  tenderness.  Musculoskeletal: Normal range of motion. He exhibits no edema and no tenderness.  Neurological: He is alert and oriented to person, place, and time. He has normal reflexes. No cranial nerve deficit. Coordination normal.  Skin: Skin is warm and dry. No rash noted. He is not diaphoretic. No erythema. No pallor.  Psychiatric: He has a normal mood and affect. His behavior is normal. Judgment and thought content normal.     Assessment/Plan Right L2-3, right L3-4, right  L4-5 spondylosis and stenosis with neurogenic claudication. Plan right L2-3, right L3-4, right L4-5 decompressive laminotomy and foraminotomies. Risks and benefits been explained. Patient wishes to proceed.  Lucyle Alumbaugh A 01/17/2013, 10:24 AM

## 2013-01-17 NOTE — Op Note (Signed)
Date of procedure: 01/17/2013  Date of dictation: Same  Service: Neurosurgery  Preoperative diagnosis: Right L2-3, right L3-4, right L4-5 stenosis secondary to spondylosis with resultant neurogenic claudication affecting the right L2, L3, L4, L5 nerve roots.  Postoperative diagnosis: Same  Procedure Name: Right L2-3, right L3-4, right L4-5 decompressive laminotomies with foraminotomies of the L2, L3, L4, L5 nerve roots.  Surgeon:Oneita Allmon A.Deina Lipsey, M.D.  Asst. Surgeon: Lovell Sheehan  Anesthesia: General  Indication: 78 year old male with severe right lower chamois pain. Using weakest and mixed radicular pattern with elements of neurogenic claudication. Workup demonstrates evidence of marked spondylosis with stenosis at L2-3, L3-4 and L4-5. Patient presents now for right-sided decompressive surgery in hopes of improving his symptoms.  Operative note: After induction anesthesia. The patient is positioned prone onto Wilson frame and her purply padded. Lumbar region prepped and draped. Incision made overlying the L2-3, L3-4 and L4-5 levels. Subperiosteal dissection performed the right side retractor placed x-ray taken levels confirmed. Laminotomies then performed at all 3 levels using high-speed drill and Kerrison rongeurs to remove the inferior aspect of lamina above the medial aspect the facet joint and the superior and the lamina below. Ligament flavum was elevated and resected these will fashion. Decompression was then performed by undercutting the facet joints laterally and performing a aggressive decompressive foraminotomy along the course the exiting L2 L3-L4 and L5 nerve roots on the right side. At this point a very thorough decompression had been achieved. There was no evidence of injury to thecal sac and nerve roots. Wound is then irrigated with and bike solution. Gelfoam was placed topically for hemostasis which then the beaded. Microscope and retractor system were removed. Hemostasis muscle she will  Montez Morita was and close in layers with Vicryl sutures. Steri-Strips and sterile dressing were applied. There were no apparent complications. Patient tolerated the procedure well and he returns they're covering postop.

## 2013-01-18 MED ORDER — HYDROCODONE-ACETAMINOPHEN 5-325 MG PO TABS: 1.0000 | ORAL_TABLET | ORAL | Status: DC | PRN

## 2013-01-18 MED ORDER — CYCLOBENZAPRINE HCL 10 MG PO TABS: 10.0000 mg | ORAL_TABLET | Freq: Three times a day (TID) | ORAL | Status: DC | PRN

## 2013-01-18 NOTE — Discharge Summary (Signed)
Physician Discharge Summary  Patient ID: Roberto Adams MRN: [id number] DOB/AGE: 12-26-1934 79 y.o.  Admit date: 01/17/2013 Discharge date: 01/18/2013  Admission Diagnoses:  Discharge Diagnoses:  Principal Problem:   Spinal stenosis, lumbar region, with neurogenic claudication   Discharged Condition: good  Hospital Course: Patient in the hospital where he underwent uncomplicated three-level lumbar decompressive surgery. Postoperatively he is done very well. Preoperative back and leg pain resolved. Ambulating well. Voiding without difficulty. Ready for discharge home.  Consults:   Significant Diagnostic Studies:   Treatments:   Discharge Exam: Blood pressure 109/63, pulse 85, temperature 98.2 F (36.8 C), temperature source Oral, resp. rate 18, SpO2 97.00%. Awake and alert. Oriented and appropriate. Cranial nerve function intact. Motor and sensory function of the extremities normal. Wound clean and dry. Chest abdomen benign. Disposition: Final discharge disposition not confirmed     Medication List    TAKE these medications       aspirin 81 MG tablet  Take 81 mg by mouth daily.     cyclobenzaprine 10 MG tablet  Commonly known as:  FLEXERIL  Take 1 tablet (10 mg total) by mouth 3 (three) times daily as needed for muscle spasms.     fish oil-omega-3 fatty acids 1000 MG capsule  Take 1 g by mouth daily. Stopped for surgery     Garlic 100 MG Tabs  Take by mouth. Stopped for surgery     hydrochlorothiazide 25 MG tablet  Commonly known as:  HYDRODIURIL  Take 25 mg by mouth daily.     HYDROcodone-acetaminophen 5-325 MG per tablet  Commonly known as:  NORCO/VICODIN  Take 1-2 tablets by mouth every 4 (four) hours as needed.     lisinopril 40 MG tablet  Commonly known as:  PRINIVIL,ZESTRIL  Take 40 mg by mouth daily.     multivitamin with minerals Tabs  Take 1 tablet by mouth daily.     SAW PALMETTO PO  Take 1 tablet by mouth daily. Stopped for surgery            Follow-up Information   Follow up with Aul Mangieri A, MD. Call in 1 week. (As for Lurena Joiner)    Contact information:   [address] [place] [address] [phone]       Signed: Yocelyn Brocious A 01/18/2013, 8:29 AM

## 2013-01-18 NOTE — Progress Notes (Signed)
Pt doing well. Pt and wife given D/C instructions with Rx's, verbal understanding given. Pt D/C'd home via wheelchair @ 0920 per MD order. Rema Fendt, RN

## 2013-01-19 ENCOUNTER — Encounter (HOSPITAL_COMMUNITY): Payer: Self-pay | Admitting: Neurosurgery

## 2013-03-25 ENCOUNTER — Ambulatory Visit: Payer: Self-pay | Admitting: Oncology

## 2013-03-28 ENCOUNTER — Ambulatory Visit: Payer: Self-pay | Admitting: Oncology

## 2013-03-29 LAB — COMPREHENSIVE METABOLIC PANEL
Albumin: 4 g/dL (ref 3.4–5.0)
Anion Gap: 6 — ABNORMAL LOW (ref 7–16)
BUN: 18 mg/dL (ref 7–18)
Bilirubin,Total: 0.6 mg/dL (ref 0.2–1.0)
Calcium, Total: 9 mg/dL (ref 8.5–10.1)
Chloride: 101 mmol/L (ref 98–107)
Co2: 29 mmol/L (ref 21–32)
Creatinine: 1.03 mg/dL (ref 0.60–1.30)
EGFR (African American): >60
EGFR (Non-African Amer.): >60
Osmolality: 274 (ref 275–301)
Potassium: 3.6 mmol/L (ref 3.5–5.1)
SGPT (ALT): 28 U/L (ref 12–78)
Sodium: 136 mmol/L (ref 136–145)

## 2013-03-29 LAB — CBC CANCER CENTER
Eosinophil %: 3.8 %
HCT: 40.9 % (ref 40.0–52.0)
HGB: 14.2 g/dL (ref 13.0–18.0)
Lymphocyte #: 1.6 x10 3/mm (ref 1.0–3.6)
Lymphocyte %: 25.1 %
Neutrophil %: 60.2 %
Platelet: 192 x10 3/mm (ref 150–440)
RBC: 4.97 10*6/uL (ref 4.40–5.90)
RDW: 15.8 % — ABNORMAL HIGH (ref 11.5–14.5)

## 2013-04-12 ENCOUNTER — Ambulatory Visit: Payer: Self-pay | Admitting: Oncology

## 2013-12-09 ENCOUNTER — Emergency Department: Payer: Self-pay | Admitting: Emergency Medicine

## 2013-12-09 LAB — URINALYSIS, COMPLETE
BILIRUBIN, UR: NEGATIVE
Glucose,UR: NEGATIVE mg/dL (ref 0–75)
Hyaline Cast: 1
Ketone: NEGATIVE
Leukocyte Esterase: NEGATIVE
Nitrite: NEGATIVE
PH: 5 (ref 4.5–8.0)
Protein: NEGATIVE
SPECIFIC GRAVITY: 1.011 (ref 1.003–1.030)
Squamous Epithelial: NONE SEEN

## 2013-12-16 ENCOUNTER — Emergency Department: Payer: Self-pay | Admitting: Emergency Medicine

## 2013-12-16 LAB — URINALYSIS, COMPLETE
Bilirubin,UR: NEGATIVE
Blood: NEGATIVE
GLUCOSE, UR: NEGATIVE mg/dL (ref 0–75)
KETONE: NEGATIVE
Nitrite: NEGATIVE
PH: 6 (ref 4.5–8.0)
PROTEIN: NEGATIVE
SQUAMOUS EPITHELIAL: NONE SEEN
Specific Gravity: 1.025 (ref 1.003–1.030)

## 2014-07-22 DIAGNOSIS — F09 Unspecified mental disorder due to known physiological condition: Secondary | ICD-10-CM

## 2014-07-22 DIAGNOSIS — E7800 Pure hypercholesterolemia, unspecified: Secondary | ICD-10-CM

## 2014-07-22 DIAGNOSIS — Z859 Personal history of malignant neoplasm, unspecified: Secondary | ICD-10-CM | POA: Insufficient documentation

## 2014-07-22 DIAGNOSIS — I10 Essential (primary) hypertension: Secondary | ICD-10-CM | POA: Insufficient documentation

## 2014-07-22 HISTORY — DX: Unspecified mental disorder due to known physiological condition: F09

## 2014-07-22 HISTORY — DX: Pure hypercholesterolemia, unspecified: E78.00

## 2014-08-08 DIAGNOSIS — J300 Vasomotor rhinitis: Secondary | ICD-10-CM | POA: Insufficient documentation

## 2014-08-08 DIAGNOSIS — D518 Other vitamin B12 deficiency anemias: Secondary | ICD-10-CM | POA: Insufficient documentation

## 2014-08-08 HISTORY — DX: Vasomotor rhinitis: J30.0

## 2015-08-08 ENCOUNTER — Inpatient Hospital Stay
Admission: EM | Admit: 2015-08-08 | Discharge: 2015-08-09 | DRG: 069 | Disposition: A | Discharge status: Home / Self Care | Payer: Medicare Other | Attending: Internal Medicine | Admitting: Internal Medicine

## 2015-08-08 ENCOUNTER — Encounter: Payer: Self-pay | Admitting: Internal Medicine

## 2015-08-08 ENCOUNTER — Emergency Department: Payer: Medicare Other

## 2015-08-08 DIAGNOSIS — Z87891 Personal history of nicotine dependence: Secondary | ICD-10-CM | POA: Diagnosis not present

## 2015-08-08 DIAGNOSIS — Z923 Personal history of irradiation: Secondary | ICD-10-CM | POA: Diagnosis not present

## 2015-08-08 DIAGNOSIS — N401 Enlarged prostate with lower urinary tract symptoms: Secondary | ICD-10-CM | POA: Diagnosis present

## 2015-08-08 DIAGNOSIS — Z8249 Family history of ischemic heart disease and other diseases of the circulatory system: Secondary | ICD-10-CM | POA: Diagnosis not present

## 2015-08-08 DIAGNOSIS — I2510 Atherosclerotic heart disease of native coronary artery without angina pectoris: Secondary | ICD-10-CM | POA: Diagnosis present

## 2015-08-08 DIAGNOSIS — I10 Essential (primary) hypertension: Secondary | ICD-10-CM | POA: Diagnosis present

## 2015-08-08 DIAGNOSIS — Z66 Do not resuscitate: Secondary | ICD-10-CM | POA: Diagnosis present

## 2015-08-08 DIAGNOSIS — Z820 Family history of epilepsy and other diseases of the nervous system: Secondary | ICD-10-CM | POA: Diagnosis not present

## 2015-08-08 DIAGNOSIS — Z8042 Family history of malignant neoplasm of prostate: Secondary | ICD-10-CM | POA: Diagnosis not present

## 2015-08-08 DIAGNOSIS — E785 Hyperlipidemia, unspecified: Secondary | ICD-10-CM | POA: Diagnosis present

## 2015-08-08 DIAGNOSIS — G459 Transient cerebral ischemic attack, unspecified: Principal | ICD-10-CM | POA: Diagnosis present

## 2015-08-08 DIAGNOSIS — Z79899 Other long term (current) drug therapy: Secondary | ICD-10-CM

## 2015-08-08 DIAGNOSIS — Z9889 Other specified postprocedural states: Secondary | ICD-10-CM

## 2015-08-08 DIAGNOSIS — M1990 Unspecified osteoarthritis, unspecified site: Secondary | ICD-10-CM | POA: Diagnosis present

## 2015-08-08 DIAGNOSIS — Z85828 Personal history of other malignant neoplasm of skin: Secondary | ICD-10-CM

## 2015-08-08 DIAGNOSIS — Z9221 Personal history of antineoplastic chemotherapy: Secondary | ICD-10-CM

## 2015-08-08 DIAGNOSIS — Z7982 Long term (current) use of aspirin: Secondary | ICD-10-CM

## 2015-08-08 DIAGNOSIS — Z85118 Personal history of other malignant neoplasm of bronchus and lung: Secondary | ICD-10-CM | POA: Diagnosis not present

## 2015-08-08 DIAGNOSIS — Z811 Family history of alcohol abuse and dependence: Secondary | ICD-10-CM | POA: Diagnosis not present

## 2015-08-08 HISTORY — DX: Transient cerebral ischemic attack, unspecified: G45.9

## 2015-08-08 HISTORY — DX: Atherosclerotic heart disease of native coronary artery without angina pectoris: I25.10

## 2015-08-08 HISTORY — DX: Hyperlipidemia, unspecified: E78.5

## 2015-08-08 LAB — CBC
HCT: 41.2 % (ref 40.0–52.0)
Hemoglobin: 13.9 g/dL (ref 13.0–18.0)
MCH: 28.2 pg (ref 26.0–34.0)
MCHC: 33.7 g/dL (ref 32.0–36.0)
MCV: 83.6 fL (ref 80.0–100.0)
PLATELETS: 205 10*3/uL (ref 150–440)
RBC: 4.93 MIL/uL (ref 4.40–5.90)
RDW: 15.4 % — AB (ref 11.5–14.5)
WBC: 7.5 10*3/uL (ref 3.8–10.6)

## 2015-08-08 LAB — TROPONIN I
Troponin I: <0.03 ng/mL (ref <0.031)
Troponin I: <0.03 ng/mL (ref <0.031)

## 2015-08-08 LAB — COMPREHENSIVE METABOLIC PANEL
ALK PHOS: 82 U/L (ref 38–126)
ALT: 25 U/L (ref 17–63)
AST: 33 U/L (ref 15–41)
Albumin: 4.3 g/dL (ref 3.5–5.0)
Anion gap: 10 (ref 5–15)
BUN: 17 mg/dL (ref 6–20)
CALCIUM: 9.1 mg/dL (ref 8.9–10.3)
CO2: 25 mmol/L (ref 22–32)
CREATININE: 1.11 mg/dL (ref 0.61–1.24)
Chloride: 102 mmol/L (ref 101–111)
GFR calc non Af Amer: >60 mL/min (ref >60)
Glucose, Bld: 108 mg/dL — ABNORMAL HIGH (ref 65–99)
Potassium: 3.3 mmol/L — ABNORMAL LOW (ref 3.5–5.1)
SODIUM: 137 mmol/L (ref 135–145)
Total Bilirubin: 0.6 mg/dL (ref 0.3–1.2)
Total Protein: 7.8 g/dL (ref 6.5–8.1)

## 2015-08-08 LAB — PROTIME-INR
INR: 1.05
Prothrombin Time: 13.9 seconds (ref 11.4–15.0)

## 2015-08-08 MED ORDER — ASPIRIN 81 MG PO CHEW
324.0000 mg | CHEWABLE_TABLET | Freq: Once | ORAL | Status: AC
  Administered 2015-08-08: 324 mg via ORAL
  Filled 2015-08-08: qty 4

## 2015-08-08 NOTE — ED Notes (Signed)
Dr. Paduchowski at bedside.  

## 2015-08-08 NOTE — ED Provider Notes (Signed)
[facility] Emergency Department Provider Note  Time seen: 9:28 PM  I have reviewed the triage vital signs and the nursing notes.   HISTORY  Chief Complaint Code Stroke    HPI Roberto Adams is a 80 y.o. male with a past medical history of hypertension, arthritis, presents the emergency department with acute onset of difficulty speaking. According to the wife the patient had several small concerning skin areas removed today by a dermatologist. They were at home following the appointment, and the wife noted acute onset of slurred speech just prior to 8 PM. States the patient could not talk, was trouble getting words out, and when he did speak it did not make sense. This lasted approximately 45 minutes and then resolved just prior to arriving at the emergency department. Patient denies any weakness or numbness of any arm or leg. Denies any confusion. Denies any history of stroke or mini stroke in the past. States he occasionally takes an aspirin a day but often forgets. Denies headache.    Past Medical History  Diagnosis Date  . Hypertension   . Cancer   . History of lung cancer 2001    Rt upper lobe; s/p chemo and radiation with recurrence  . Enlarged prostate   . Nocturia associated with benign prostatic hypertrophy   . Arthritis   . Hearing aid worn     both ears    Patient Active Problem List   Diagnosis Date Noted  . Spinal stenosis, lumbar region, with neurogenic claudication 01/17/2013    Past Surgical History  Procedure Laterality Date  . Pilonidal cyst / sinus excision  1958  . Rotator cuff repair  [id number]  . Lung cancer surgery Right 2001  . Epidural block injection    . Lumbar laminectomy/decompression microdiscectomy Right 01/17/2013    Procedure: Right Lumbar Two-three,Lumbar three-four,Lumbar Four-Five Decompressive Lumbar laminectomy;  Surgeon: Charlie Pitter, MD;  Location: [facility];  Service: Neurosurgery;  Laterality: Right;  right     Current Outpatient Rx  Name  Route  Sig  Dispense  Refill  . aspirin 81 MG tablet   Oral   Take 81 mg by mouth daily.         . cyclobenzaprine (FLEXERIL) 10 MG tablet   Oral   Take 1 tablet (10 mg total) by mouth 3 (three) times daily as needed for muscle spasms.   30 tablet   1   . fish oil-omega-3 fatty acids 1000 MG capsule   Oral   Take 1 g by mouth daily. Stopped for surgery         . Garlic 161 MG TABS   Oral   Take by mouth. Stopped for surgery         . hydrochlorothiazide (HYDRODIURIL) 25 MG tablet   Oral   Take 25 mg by mouth daily.         Marland Kitchen HYDROcodone-acetaminophen (NORCO/VICODIN) 5-325 MG per tablet   Oral   Take 1-2 tablets by mouth every 4 (four) hours as needed.   60 tablet   1   . lisinopril (PRINIVIL,ZESTRIL) 40 MG tablet   Oral   Take 40 mg by mouth daily.         . Multiple Vitamin (MULTIVITAMIN WITH MINERALS) TABS   Oral   Take 1 tablet by mouth daily.         . Saw Palmetto, Serenoa repens, (SAW PALMETTO PO)   Oral   Take 1 tablet by mouth  daily. Stopped for surgery           Allergies Review of patient's allergies indicates no known allergies.  No family history on file.  Social History Social History  Substance Use Topics  . Smoking status: Former Smoker -- 1.00 packs/day for 35 years    Types: Cigarettes  . Smokeless tobacco: Not on file     Comment: quit smoking 1992  . Alcohol Use: No    Review of Systems Constitutional: Negative for fever. Eyes: Negative for visual changes Cardiovascular: Negative for chest pain. Respiratory: Negative for shortness of breath. Gastrointestinal: Negative for abdominal pain. Positive for nausea. Neurological: Denies headache, focal weakness or numbness. 10-point ROS otherwise negative.  ____________________________________________   PHYSICAL EXAM:  VITAL SIGNS: ED Triage Vitals  Enc Vitals Group     BP 08/08/15 2116 153/77 mmHg     Pulse Rate 08/08/15 2116 96      Resp --      Temp --      Temp Source 08/08/15 2116 Oral     SpO2 08/08/15 2116 95 %     Weight 08/08/15 2116 210 lb (95.255 kg)     Height --      Head Cir --      Peak Flow --      Pain Score --      Pain Loc --      Pain Edu? --      Excl. in [place]? --     Constitutional: Alert and oriented. Well appearing and in no distress. Eyes: Normal exam ENT   Head: Normocephalic and atraumatic.   Mouth/Throat: Mucous membranes are moist. Cardiovascular: Normal rate, regular rhythm. No murmur Respiratory: Normal respiratory effort without tachypnea nor retractions. Breath sounds are clear and equal bilaterally. No wheezes/rales/rhonchi. Gastrointestinal: Soft and nontender. No distention.   Musculoskeletal: Nontender with normal range of motion in all extremities Neurologic:  Normal speech and language. No gross focal neurologic deficits are appreciated. Speech is normal. No pronator drift. Equal grip strengths. No facial droop. Cranial nerves intact. Oriented 4, normal speech. Skin:  Skin is warm, dry and intact.  Psychiatric: Mood and affect are normal. Speech and behavior are normal. ____________________________________________    EKG  EKG reviewed and interpreted by myself shows normal sinus rhythm at 97 bpm, slightly widened QRS, normal axis, slightly prolonged QTC of 502 ms, nonspecific ST changes present. No sealed elevations noted.  ____________________________________________    RADIOLOGY  CT head shows no acute abnormality  ____________________________________________   INITIAL IMPRESSION / ASSESSMENT AND PLAN / ED COURSE  Pertinent labs & imaging results that were available during my care of the patient were reviewed by me and considered in my medical decision making (see chart for details).  Patient presents with symptoms most suggestive of a TIA, status post complete resolution at this time. CT shows no acute changes. We'll dose aspirin in the emergency  department, awaiting lab results. Plan to admit the patient for further monitoring and workup.  Telemetry psych is seen, recommend admission for TIA workup. Currently not a candidate for TPA administration given complete resolution of symptoms.  NIH stroke scale of 0  Labs are within normal limits. We'll admit the patient for likely transient ischemic attack.  ____________________________________________   FINAL CLINICAL IMPRESSION(S) / ED DIAGNOSES  Transient ischemic attack   Harvest Dark, MD 08/08/15 2317

## 2015-08-08 NOTE — ED Notes (Signed)
Pt taken to CT.

## 2015-08-08 NOTE — ED Notes (Signed)
Pt to triage via w/c with no distress noted; wife reports pt had dermatological procedure today at [facility]; wife st hr PTA had sudden onset "not talking right, confused, unable to read his newspaper and couldn't remember any events of the day; denies any hx of same; pt A&Ox3 at present, MAEW,PERRL, speech clear; denies any recent illness or c/o pain at present

## 2015-08-08 NOTE — ED Notes (Signed)
CT complete, pt taken to room 9 and placed on card monitor

## 2015-08-08 NOTE — ED Notes (Signed)
Pt ambulated to the bedside commode to have a BM.

## 2015-08-09 ENCOUNTER — Inpatient Hospital Stay
Admit: 2015-08-09 | Discharge: 2015-08-09 | Disposition: A | Discharge status: Home / Self Care | Payer: Medicare Other | Attending: Internal Medicine | Admitting: Internal Medicine

## 2015-08-09 ENCOUNTER — Inpatient Hospital Stay: Payer: Medicare Other

## 2015-08-09 DIAGNOSIS — G459 Transient cerebral ischemic attack, unspecified: Secondary | ICD-10-CM | POA: Diagnosis not present

## 2015-08-09 LAB — URINALYSIS COMPLETE WITH MICROSCOPIC (ARMC ONLY)
BILIRUBIN URINE: NEGATIVE
Bacteria, UA: NONE SEEN
GLUCOSE, UA: NEGATIVE mg/dL
HGB URINE DIPSTICK: NEGATIVE
KETONES UR: NEGATIVE mg/dL
LEUKOCYTES UA: NEGATIVE
NITRITE: NEGATIVE
PH: 5 (ref 5.0–8.0)
PROTEIN: NEGATIVE mg/dL
RBC / HPF: NONE SEEN RBC/hpf (ref 0–5)
SPECIFIC GRAVITY, URINE: 1.024 (ref 1.005–1.030)

## 2015-08-09 LAB — CREATININE, SERUM
Creatinine, Ser: 1.03 mg/dL (ref 0.61–1.24)
GFR calc Af Amer: >60 mL/min (ref >60)
GFR calc non Af Amer: >60 mL/min (ref >60)

## 2015-08-09 LAB — LIPID PANEL
CHOL/HDL RATIO: 5.8 ratio
CHOLESTEROL: 186 mg/dL (ref 0–200)
HDL: 32 mg/dL — ABNORMAL LOW (ref >40)
LDL CALC: 121 mg/dL — AB (ref 0–99)
TRIGLYCERIDES: 166 mg/dL — AB (ref <150)
VLDL: 33 mg/dL (ref 0–40)

## 2015-08-09 LAB — CBC
HCT: 39.4 % — ABNORMAL LOW (ref 40.0–52.0)
HEMOGLOBIN: 13.7 g/dL (ref 13.0–18.0)
MCH: 28.8 pg (ref 26.0–34.0)
MCHC: 34.8 g/dL (ref 32.0–36.0)
MCV: 82.8 fL (ref 80.0–100.0)
PLATELETS: 198 10*3/uL (ref 150–440)
RBC: 4.76 MIL/uL (ref 4.40–5.90)
RDW: 15.4 % — ABNORMAL HIGH (ref 11.5–14.5)
WBC: 7.6 10*3/uL (ref 3.8–10.6)

## 2015-08-09 MED ORDER — INFLUENZA VAC SPLIT QUAD 0.5 ML IM SUSY
0.5000 mL | PREFILLED_SYRINGE | Freq: Once | INTRAMUSCULAR | Status: AC
  Administered 2015-08-09: 0.5 mL via INTRAMUSCULAR
  Filled 2015-08-09: qty 0.5

## 2015-08-09 MED ORDER — POTASSIUM CHLORIDE CRYS ER 20 MEQ PO TBCR
40.0000 meq | EXTENDED_RELEASE_TABLET | Freq: Once | ORAL | Status: AC
  Administered 2015-08-09: 40 meq via ORAL
  Filled 2015-08-09: qty 2

## 2015-08-09 MED ORDER — ENOXAPARIN SODIUM 40 MG/0.4ML ~~LOC~~ SOLN
40.0000 mg | Freq: Every day | SUBCUTANEOUS | Status: DC
  Administered 2015-08-09: 02:00:00 40 mg via SUBCUTANEOUS
  Filled 2015-08-09: qty 0.4

## 2015-08-09 MED ORDER — INFLUENZA VAC SPLIT QUAD 0.5 ML IM SUSY: 0.5000 mL | PREFILLED_SYRINGE | INTRAMUSCULAR | Status: DC

## 2015-08-09 MED ORDER — ONDANSETRON HCL 4 MG/2ML IJ SOLN
4.0000 mg | Freq: Four times a day (QID) | INTRAMUSCULAR | Status: DC | PRN
  Administered 2015-08-09: 03:00:00 4 mg via INTRAVENOUS
  Filled 2015-08-09: qty 2

## 2015-08-09 MED ORDER — CLOPIDOGREL BISULFATE 75 MG PO TABS: 75.0000 mg | ORAL_TABLET | Freq: Every day | ORAL | Status: AC

## 2015-08-09 MED ORDER — STROKE: EARLY STAGES OF RECOVERY BOOK
Freq: Once | Status: AC
  Administered 2015-08-09: 02:00:00

## 2015-08-09 MED ORDER — SODIUM CHLORIDE 0.9 % IJ SOLN
3.0000 mL | Freq: Two times a day (BID) | INTRAMUSCULAR | Status: DC
  Administered 2015-08-09 (×2): 3 mL via INTRAVENOUS

## 2015-08-09 MED ORDER — ASPIRIN 81 MG PO CHEW
81.0000 mg | CHEWABLE_TABLET | Freq: Every day | ORAL | Status: DC
  Administered 2015-08-09: 81 mg via ORAL
  Filled 2015-08-09: qty 1

## 2015-08-09 MED ORDER — ATORVASTATIN CALCIUM 20 MG PO TABS: 40.0000 mg | ORAL_TABLET | Freq: Every day | ORAL | Status: DC

## 2015-08-09 MED ORDER — ATORVASTATIN CALCIUM 40 MG PO TABS: 40.0000 mg | ORAL_TABLET | Freq: Every day | ORAL | Status: DC

## 2015-08-09 NOTE — Discharge Summary (Signed)
[facility] NAME: Roberto Adams    MR#:  [id number]  DATE OF BIRTH:  May 28, 1935  DATE OF ADMISSION:  08/08/2015 ADMITTING PHYSICIAN: Lance Coon, MD  DATE OF DISCHARGE: 08/09/15  PRIMARY CARE PHYSICIAN: Kirk Ruths., MD    ADMISSION DIAGNOSIS:  Transient cerebral ischemia, unspecified transient cerebral ischemia type [G45.9]  DISCHARGE DIAGNOSIS:  Principal Problem:   TIA (transient ischemic attack) Active Problems:   HTN (hypertension)   CAD (coronary artery disease)   HLD (hyperlipidemia)   SECONDARY DIAGNOSIS:   Past Medical History  Diagnosis Date  . Hypertension   . Cancer ([place])     skin  . History of lung cancer 2001    Rt upper lobe; s/p chemo and radiation with recurrence  . Enlarged prostate   . Nocturia associated with benign prostatic hypertrophy   . Arthritis   . Hearing aid worn     both ears  . CAD (coronary artery disease)   . HLD (hyperlipidemia)     HOSPITAL COURSE:   80 year old male with past medical history significant for hypertension, lung cancer currently in remission, coronary artery disease and hyperlipidemia presents to the hospital secondary to a previous episode of confusion associated with slurred speech.  1. Transient ischemic attack-patient's symptoms are resolved at this time. He is back to baseline. -MRI of the brain negative for any acute infarcts. Does show generalized atrophy, old lacunar infarcts. 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 -Patient was taking baby aspirin as needed only at home, advised to take it every day along with Plavix for 3 months. After that he can be changed over to 325 mg aspirin daily. -Appreciate neurology input. Patient can follow up with neurology as outpatient. -Physical therapy consult is pending at this time. Likely will be discharged home today. -LDL  greater than 120, started on statin  2. Hypertension-continue lisinopril at home  3. Hyperlipidemia-no history of hyperlipidemia, wanted to try try diet control, however with history of TIA now will start on statin  4. CAD -stable  Discharge today  DISCHARGE CONDITIONS:   Stable  CONSULTS OBTAINED:     DRUG ALLERGIES:  No Known Allergies  DISCHARGE MEDICATIONS:   Current Discharge Medication List    START taking these medications   Details  atorvastatin (LIPITOR) 40 MG tablet Take 1 tablet (40 mg total) by mouth daily at 6 PM. Qty: 30 tablet, Refills: 0    clopidogrel (PLAVIX) 75 MG tablet Take 1 tablet (75 mg total) by mouth daily. Qty: 90 tablet, Refills: 0      CONTINUE these medications which have NOT CHANGED   Details  aspirin 81 MG tablet Take 81 mg by mouth daily.    lisinopril (PRINIVIL,ZESTRIL) 40 MG tablet Take 40 mg by mouth daily.         DISCHARGE INSTRUCTIONS:   1. PCP f/u in 1 week 2. Neurology f/u in 6-8 weeks  If you experience worsening of your admission symptoms, develop shortness of breath, life threatening emergency, suicidal or homicidal thoughts you must seek medical attention immediately by calling 911 or calling your MD immediately  if symptoms less severe.  You Must read complete instructions/literature along with all the possible adverse reactions/side effects for all the Medicines you take and that have been prescribed to you. Take any new Medicines after you have completely understood and accept all the possible  adverse reactions/side effects.   Please note  You were cared for by a hospitalist during your hospital stay. If you have any questions about your discharge medications or the care you received while you were in the hospital after you are discharged, you can call the unit and asked to speak with the hospitalist on call if the hospitalist that took care of you is not available. Once you are discharged, your primary care  physician will handle any further medical issues. Please note that NO REFILLS for any discharge medications will be authorized once you are discharged, as it is imperative that you return to your primary care physician (or establish a relationship with a primary care physician if you do not have one) for your aftercare needs so that they can reassess your need for medications and monitor your lab values.    Today   CHIEF COMPLAINT:   Chief Complaint  Patient presents with  . Code Stroke     VITAL SIGNS:  Blood pressure 135/84, pulse 84, temperature 98 F (36.7 C), temperature source Oral, resp. rate 14, height 5' 5.5" (1.664 m), weight 96.389 kg (212 lb 8 oz), SpO2 96 %.  I/O:   Intake/Output Summary (Last 24 hours) at 08/09/15 1147 Last data filed at 08/09/15 0735  Gross per 24 hour  Intake      0 ml  Output      0 ml  Net      0 ml    PHYSICAL EXAMINATION:   Physical Exam  GENERAL:  80 y.o.-year-old elderly patient lying in the bed with no acute distress.  EYES: Pupils equal, round, reactive to light and accommodation. No scleral icterus. Extraocular muscles intact.  HEENT: Head atraumatic, normocephalic. Oropharynx and nasopharynx clear.  NECK:  Supple, no jugular venous distention. No thyroid enlargement, no tenderness.  LUNGS: Normal breath sounds bilaterally, no wheezing, rales,rhonchi or crepitation. No use of accessory muscles of respiration. Decreased bibasilar breath sounds CARDIOVASCULAR: S1, S2 normal. No rubs, or gallops. 3/6 systolic murmur present ABDOMEN: Soft, non-tender, non-distended. Bowel sounds present. No organomegaly or mass.  EXTREMITIES: No pedal edema, cyanosis, or clubbing.  NEUROLOGIC: Cranial nerves II through XII are intact. Muscle strength 5/5 in all extremities. Sensation intact. Gait not checked.  PSYCHIATRIC: The patient is alert and oriented x 3.  SKIN: No obvious rash, lesion, or ulcer.   DATA REVIEW:   CBC  Recent Labs Lab  08/09/15 0430  WBC 7.6  HGB 13.7  HCT 39.4*  PLT 198    Chemistries   Recent Labs Lab 08/08/15 2124 08/09/15 0430  NA 137  --   K 3.3*  --   CL 102  --   CO2 25  --   GLUCOSE 108*  --   BUN 17  --   CREATININE 1.11 1.03  CALCIUM 9.1  --   AST 33  --   ALT 25  --   ALKPHOS 82  --   BILITOT 0.6  --     Cardiac Enzymes  Recent Labs Lab 08/08/15 2226  TROPONINI <0.03    Microbiology Results  Results for orders placed or performed during the hospital encounter of 01/13/13  Surgical pcr screen     Status: None   Collection Time: 01/13/13 10:32 AM  Result Value Ref Range Status   MRSA, PCR NEGATIVE NEGATIVE Final   Staphylococcus aureus NEGATIVE NEGATIVE Final    Comment:        The Xpert SA Assay (FDA approved  for NASAL specimens in patients over 66 years of age), is one component of a comprehensive surveillance program.  Test performance has been validated by [employer] for patients greater than or equal to 53 year old. It is not intended to diagnose infection nor to guide or monitor treatment.    RADIOLOGY:  Ct Head Wo Contrast  08/08/2015  CLINICAL DATA:  Confusion with dysphasia EXAM: CT HEAD WITHOUT CONTRAST TECHNIQUE: Contiguous axial images were obtained from the base of the skull through the vertex without intravenous contrast. COMPARISON:  Brain MRI September 22, 2011 FINDINGS: Moderate diffuse atrophy is stable. There is no intracranial mass, hemorrhage, extra-axial fluid collection, or midline shift. There is patchy small vessel disease in the centra semiovale bilaterally. Elsewhere gray-white compartments appear normal. No acute infarct is appreciable. The middle cerebral arteries show symmetric attenuation bilaterally. Bony calvarium appears intact. The mastoid air cells are clear. IMPRESSION: Atrophy with patchy supratentorial small vessel disease. No acute infarct evident. No hemorrhage or mass effect. Critical Value/emergent results were called  by telephone at the time of interpretation on 08/08/2015 at 9:14 pm to Dr. Harvest Dark , who verbally acknowledged these results. Electronically Signed   By: Lowella Grip III M.D.   On: 08/08/2015 21:16   Mr Brain Wo Contrast  08/09/2015  CLINICAL DATA:  Mental status change with abnormal speech and inability to read simple text. Symptoms lasted approximately 1 hour. EXAM: MRI HEAD WITHOUT CONTRAST TECHNIQUE: Multiplanar, multiecho pulse sequences of the brain and surrounding structures were obtained without intravenous contrast. COMPARISON:  CT head without contrast 08/08/2015 and MRI brain 09/22/2011. FINDINGS: The diffusion-weighted images demonstrate no evidence for acute or subacute infarction. Moderate generalized atrophy is present. There is some progression of diffuse periventricular and subcortical T2 change bilaterally. Flow is present in the vertebral arteries and the basilar artery. The right internal carotid artery is now occluded. There is flow in the right A1 and M1 segments. The left internal carotid artery demonstrates flow. No acute hemorrhage or mass lesion is present. The internal auditory canals are within normal limits. A brainstem is normal. Remote lacunar infarcts are evident within the cerebellum bilaterally without significant interval change. Dilated perivascular spaces are present in the basal ganglia. A remote left lentiform nucleus infarct is present. With A left lens replacement is present. The globes and orbits are otherwise intact. The paranasal sinuses and mastoid air cells are clear. IMPRESSION: 1. Interval occlusion of the right internal carotid artery. Flow is present in the right A1 and M1 segments. 2. No acute or subacute infarct. 3. Progression of periventricular and scattered subcortical small vessel ischemic change since 2012. These results will be called to the ordering clinician or representative by the Radiologist Assistant, and communication documented in  the PACS or zVision Dashboard. Electronically Signed   By: San Morelle M.D.   On: 08/09/2015 08:45   US Carotid Bilateral  08/09/2015  CLINICAL DATA:  TIA, hypertension.  Coronary disease. EXAM: BILATERAL CAROTID DUPLEX ULTRASOUND TECHNIQUE: Pearline Cables scale imaging, color Doppler and duplex ultrasound was performed of bilateral carotid and vertebral arteries in the neck. COMPARISON:  None. REVIEW OF SYSTEMS: Quantification of carotid stenosis is based on velocity parameters that correlate the residual internal carotid diameter with NASCET-based stenosis levels, using the diameter of the distal internal carotid lumen as the denominator for stenosis measurement. The following velocity measurements were obtained: PEAK SYSTOLIC/END DIASTOLIC RIGHT ICA:  81/40cm/sec CCA:                     83/3AS/NKN SYSTOLIC ICA/CCA RATIO:  1.3 DIASTOLIC ICA/CCA RATIO: 5.3 ECA:                     66cm/sec LEFT ICA:                     98/38cm/sec CCA:                     39/76BH/ALP SYSTOLIC ICA/CCA RATIO:  1.0 DIASTOLIC ICA/CCA RATIO: 1.2 ECA:                     125cm/sec FINDINGS: RIGHT CAROTID ARTERY: Mild intimal thickening in the common carotid artery, with externalized waveform origin occlusion of the ICA. No contiguous flow on color or power Doppler. Eccentric partially calcified plaque in the bulb without high-grade stenosis. RIGHT VERTEBRAL ARTERY:  Normal flow direction and waveform. LEFT CAROTID ARTERY: Eccentric plaque in the carotid bulb and proximal ICA without high-grade stenosis. Normal waveforms and color Doppler signal. Distal ICA mildly tortuous. LEFT VERTEBRAL ARTERY: Normal flow direction and waveform. IMPRESSION: 1. Origin occlusion of the right ICA. No color or power Doppler evidence of string sign. 2. Left proximal ICA plaque resulting in less than 50% diameter stenosis. The exam does not exclude plaque ulceration or embolization. Continued surveillance recommended. Electronically  Signed   By: Lucrezia Europe M.D.   On: 08/09/2015 10:10   Mr Lovenia Kim  08/09/2015  CLINICAL DATA:  TIA. 1 hour episode of difficulty recalling names and abnormal speech. Unable to read simple text. Symptoms have since resolved. EXAM: MRA HEAD WITHOUT CONTRAST TECHNIQUE: Angiographic images of the Circle of Willis were obtained using MRA technique without intravenous contrast. COMPARISON:  MRI brain from the same day. FINDINGS: The right internal carotid artery is occluded. The left internal carotid artery is tortuous in the neck without focal stenosis. There is mild narrowing in the terminal segment the less than 50% relative to the more distal vessel. The left A1 segment is patent. The anterior communicating artery is patent in both A2 segments demonstrate flow. Mild to moderate narrowing is present in the more distal right A2 segment. There is a moderate stenosis of the distal left M1 segment prior to the bifurcation. Significant signal loss is present proximal to the right M1 segment suggest near occlusive stenosis or very slow flow. Only a single right MCA branch is visualized. There is poor limited signal within this branch suggesting slow flow. A single left MCA branch is visualized but with increased signal compared to the right. The left vertebral artery is the dominant vessel. There is significant signal loss and high-grade stenosis in the distal right vertebral artery. The basilar artery is within normal limits. Both posterior cerebral arteries originate from the basilar tip. There is moderate signal loss in the proximal PCA vessels bilaterally suggesting significant stenosis. IMPRESSION: 1. Occlusion of the right internal carotid artery. 2. The anterior communicating artery is patent and both ACA vessels fill with mild to moderate distal stenosis. 3. Significant attenuation of signal in the distal right M1 segment and minimal visibility of right MCA branch vessels suggesting very slow or highly stenotic  distal right MCA flow. This is likely a reflection of poor perfusion pressure. 4. Moderate narrowing of the distal left M1 segment with significant attenuation of left MCA branch vessels is  well, though not as severe as on the right. 5. High-grade stenosis of the distal right vertebral artery. 6. Moderate to high-grade stenosis of the proximal posterior cerebral arteries bilaterally. These results will be called to the ordering clinician or representative by the Radiologist Assistant, and communication documented in the PACS or zVision Dashboard. Electronically Signed   By: San Morelle M.D.   On: 08/09/2015 08:53    EKG:   Orders placed or performed during the hospital encounter of 08/08/15  . ED EKG  . ED EKG      Management plans discussed with the patient, family and they are in agreement.  CODE STATUS:     Code Status Orders        Start     Ordered   08/09/15 0034  Do not attempt resuscitation (DNR)   Continuous    Question Answer Comment  In the event of cardiac or respiratory ARREST Do not call a "code blue"   In the event of cardiac or respiratory ARREST Do not perform Intubation, CPR, defibrillation or ACLS   In the event of cardiac or respiratory ARREST Use medication by any route, position, wound care, and other measures to relive pain and suffering. May use oxygen, suction and manual treatment of airway obstruction as needed for comfort.      08/09/15 0033    Advance Directive Documentation        Most Recent Value   Type of Advance Directive  Healthcare Power of Attorney, Living will   Pre-existing out of facility DNR order (yellow form or pink MOST form)     "MOST" Form in Place?        TOTAL TIME TAKING CARE OF THIS PATIENT: 38 minutes.    Gladstone Lighter M.D on 08/09/2015 at 11:47 AM  Between 7am to 6pm - Pager - [phone]  After 6pm go to [email or web address] - password [email or web address] [facility]  Office  [phone]  CC: Primary  care physician; Kirk Ruths., MD

## 2015-08-09 NOTE — Evaluation (Signed)
Physical Therapy Evaluation Patient Details Name: Roberto Adams MRN: [id number] DOB: 1935/01/08 Today's Date: 08/09/2015   History of Present Illness  This patient is a 80 year old male who came to [facility] with expressive aphasia and confusion and admitted with TIA.  Clinical Impression  Prior to admission, pt was independent but occasionally used SPC for balance as needed.  Pt lives with his wife (who is around all the time) in 1 level home with 4 STE with L railing.  Currently pt is modified independent with bed mobility, SBA for transfers, and SBA ambulating with SPC (pt mildly unsteady with no AD but appeared steady without loss of balance using SPC).  Pt scored 25/28 on Tinetti balance assessment indicating pt is at low risk for falls.  Pt would benefit from skilled PT to continue to work on gait and balance during hospital stay (to prevent decline in status during hospitalization) but anticipate no PT needs on discharge (pt's wife reports pt is really close to his baseline level of functional mobility).  Recommend pt discharge to home when medically appropriate with support of family.     Follow Up Recommendations  (No PT follow up upon discharge from hospital)    Equipment Recommendations   (pt already owns [facility])    Recommendations for Other Services       Precautions / Restrictions Precautions Precautions: Fall Restrictions Weight Bearing Restrictions: No      Mobility  Bed Mobility Overal bed mobility: Modified Independent             General bed mobility comments: HOB elevated; Supine to/from sit  Transfers Overall transfer level: Needs assistance Equipment used: None Transfers: Sit to/from [employer] Transfers Sit to Stand: Supervision Stand pivot transfers: Supervision       General transfer comment: steady without loss of balance  Ambulation/Gait Ambulation/Gait assistance: Supervision;Min guard Ambulation Distance (Feet):  (150 feet with no AD;  100 feet with SPC) Assistive device: None;Straight cane Gait Pattern/deviations: Step-through pattern Gait velocity: mild decreased without AD; normal speed with SPC   General Gait Details: wider BOS and decreased B step length B (pt and pt's wife report this is baseline); mildly unsteady with no AD with occasional altered stepping pattern to R (pt used hall railing to steady himself); trialed SPC and pt appearing steady without loss of balance  Stairs Stairs: Yes Stairs assistance: Min guard Stair Management: One rail Left Number of Stairs: 4 General stair comments: steady without loss of balance  Wheelchair Mobility    Modified Rankin (Stroke Patients Only)       Balance Overall balance assessment: Needs assistance Sitting-balance support: Bilateral upper extremity supported;Feet supported Sitting balance-Leahy Scale: Good     Standing balance support: No upper extremity supported Standing balance-Leahy Scale: Good                   Standardized Balance Assessment Standardized Balance Assessment :  (Tinetti balance assessment: 25/28 score (pt uses UE's to stand; wide BOS with gait; and uses SPC))           Pertinent Vitals/Pain Pain Assessment: No/denies pain  Vitals stable and WFL throughout treatment session.    Home Living Family/patient expects to be discharged to:: Private residence Living Arrangements: Spouse/significant other Available Help at Discharge: Family;Available 24 hours/day Type of Home: House (has basement in which pt does not use) Home Access: Stairs to enter Entrance Stairs-Rails: Left (L rail ascending) Entrance Stairs-Number of Steps: 4 Home Layout: One level (  with basement) Home Equipment: Cane - single point      Prior Function Level of Independence: Independent with assistive device(s)         Comments: Occasional use of SPC     Hand Dominance        Extremity/Trunk Assessment   Upper Extremity Assessment: Defer to  OT evaluation           Lower Extremity Assessment: Overall WFL for tasks assessed         Communication   Communication: HOH  Cognition Arousal/Alertness: Awake/alert Behavior During Therapy:  (mildly impulsive but may be complicated d/t HOH) Overall Cognitive Status: Within Functional Limits for tasks assessed                      General Comments   Nursing cleared pt for participation in physical therapy.  Pt agreeable to PT session.  Pt's wife present during session.    Exercises  Trialed SPC during session d/t mild unsteadiness without AD ambulating; performed Tinetti balance assessment; educate pt and pt's wife on fall prevention/home safety.      Assessment/Plan    PT Assessment Patient needs continued PT services  PT Diagnosis Difficulty walking   PT Problem List Decreased mobility  PT Treatment Interventions DME instruction;Gait training;Stair training;Functional mobility training;Therapeutic activities;Therapeutic exercise;Balance training;Neuromuscular re-education;Patient/family education;Manual techniques   PT Goals (Current goals can be found in the Care Plan section) Acute Rehab PT Goals Patient Stated Goal: to go home PT Goal Formulation: With patient/family Time For Goal Achievement: 08/23/15 Potential to Achieve Goals: Good    Frequency 7X/week   Barriers to discharge        Co-evaluation               End of Session Equipment Utilized During Treatment: Gait belt Activity Tolerance: Patient tolerated treatment well Patient left: in bed;with call bell/phone within reach;with bed alarm set;with family/visitor present Nurse Communication: Mobility status         Time: 0156-1537 PT Time Calculation (min) (ACUTE ONLY): 38 min   Charges:   PT Evaluation $Initial PT Evaluation Tier I: 1 Procedure PT Treatments $Gait Training: 8-22 mins $Therapeutic Activity: 8-22 mins   PT G CodesLeitha Bleak 09/03/2015, 4:11  PM Leitha Bleak, [place]

## 2015-08-09 NOTE — Discharge Instructions (Signed)
Aspirin and Your Heart  Aspirin is a medicine that affects the way blood clots. Aspirin can be used to help reduce the risk of blood clots, heart attacks, and other heart-related problems.  SHOULD I TAKE ASPIRIN? Your health care provider will help you determine whether it is safe and beneficial for you to take aspirin daily. Taking aspirin daily may be beneficial if you:  Have had a heart attack or chest pain.  Have undergone open heart surgery such as coronary artery bypass surgery (CABG).  Have had coronary angioplasty.  Have experienced a stroke or transient ischemic attack (TIA).  Have peripheral vascular disease (PVD).  Have chronic heart rhythm problems such as atrial fibrillation. ARE THERE ANY RISKS OF TAKING ASPIRIN DAILY? Daily use of aspirin can increase your risk of side effects. Some of these include:  Bleeding. Bleeding problems can be minor or serious. An example of a minor problem is a cut that does not stop bleeding. An example of a more serious problem is stomach bleeding or bleeding into the brain. Your risk of bleeding is increased if you are also taking non-steroidal anti-inflammatory medicine (NSAIDs).  Increased bruising.  Upset stomach.  An allergic reaction. People who have nasal polyps have an increased risk of developing an aspirin allergy. WHAT ARE SOME GUIDELINES I SHOULD FOLLOW WHEN TAKING ASPIRIN?   Take aspirin only as directed by your health care provider. Make sure you understand how much you should take and what form you should take. The two forms of aspirin are:  Non-enteric-coated. This type of aspirin does not have a coating and is absorbed quickly. Non-enteric-coated aspirin is usually recommended for people with chest pain. This type of aspirin also comes in a chewable form.  Enteric-coated. This type of aspirin has a special coating that releases the medicine very slowly. Enteric-coated aspirin causes less stomach upset than non-enteric-coated  aspirin. This type of aspirin should not be chewed or crushed.  Drink alcohol in moderation. Drinking alcohol increases your risk of bleeding. WHEN SHOULD I SEEK MEDICAL CARE?   You have unusual bleeding or bruising.  You have stomach pain.  You have an allergic reaction. Symptoms of an allergic reaction include:  Hives.  Itchy skin.  Swelling of the lips, tongue, or face.  You have ringing in your ears. WHEN SHOULD I SEEK IMMEDIATE MEDICAL CARE?   Your bowel movements are bloody, dark red, or black in color.  You vomit or cough up blood.  You have blood in your urine.  You cough, wheeze, or feel short of breath. If you have any of the following symptoms, this is an emergency. Do not wait to see if the pain will go away. Get medical help at once. Call your local emergency services (911 in the [place] Do not drive yourself to the hospital.  You have severe chest pain, especially if the pain is crushing or pressure-like and spreads to the arms, back, neck, or jaw.  You have stroke-like symptoms, such as:   Loss of vision.   Difficulty talking.   Numbness or weakness on one side of your body.   Numbness or weakness in your arm or leg.   Not thinking clearly or feeling confused.    This information is not intended to replace advice given to you by your health care provider. Make sure you discuss any questions you have with your health care provider.   Document Released: 09/11/2008 Document Revised: 10/20/2014 Document Reviewed: 01/04/2014 Elsevier Interactive Patient Education 2016 Elsevier   Inc.  

## 2015-08-09 NOTE — Progress Notes (Signed)
*  PRELIMINARY RESULTS* Echocardiogram 2D Echocardiogram has been performed.  Roberto Adams 08/09/2015, 1:42 PM

## 2015-08-09 NOTE — Evaluation (Signed)
Clinical/Bedside Swallow Evaluation Patient Details  Name: Roberto Adams MRN: [id number] Date of Birth: 1934/12/06  Today's Date: 08/09/2015 Time: SLP Start Time (ACUTE ONLY): 1205 SLP Stop Time (ACUTE ONLY): 1305 SLP Time Calculation (min) (ACUTE ONLY): 60 min  Past Medical History:  Past Medical History  Diagnosis Date  . Hypertension   . Cancer ([place])     skin  . History of lung cancer 2001    Rt upper lobe; s/p chemo and radiation with recurrence  . Enlarged prostate   . Nocturia associated with benign prostatic hypertrophy   . Arthritis   . Hearing aid worn     both ears  . CAD (coronary artery disease)   . HLD (hyperlipidemia)    Past Surgical History:  Past Surgical History  Procedure Laterality Date  . Pilonidal cyst / sinus excision  1958  . Rotator cuff repair  [id number]  . Lung cancer surgery Right 2001  . Epidural block injection    . Lumbar laminectomy/decompression microdiscectomy Right 01/17/2013    Procedure: Right Lumbar Two-three,Lumbar three-four,Lumbar Four-Five Decompressive Lumbar laminectomy;  Surgeon: Charlie Pitter, MD;  Location: [facility];  Service: Neurosurgery;  Laterality: Right;  right   HPI:  Pt is a 80 y.o. male who presents with TIA. Patient states that on the day of presentation the ED he had a few spots of skin cancer removed at dermatology clinic. Afterwards he felt "weak", with malaise. He went home, and try to eat but did not feel like eating very much. Later in the evening he developed some symptoms of confusion. He states he was unable to remember his daughter's name, and then he was unable to say clearly the things that he wanted to say. He also was unable to read simple text. These symptoms lasted overall about an hour. Within that time his wife convinced him to come to the ED for evaluation. Initial workup in the ED largely benign, but tele-neurology recommended admission for workup for TIA. MRI revealed Atrophy with patchy supratentorial  small vessel disease. No acute or subacute infarct noted. Stenosis noted. Pt verbally conversive; talkative. No expressive language deficits noted; no Aphasia noted. Pt denied any dysphagia.    Assessment / Plan / Recommendation Clinical Impression  Pt appears at reduced risk for aspiration at this time w/ po diet of regular and thin liquids. Pt consumed trials of thin liquids and solid foods w/ no overt s/s of aspiration noted; no oral phase deficits noted. Pt fed self following general aspiration precautions and denied any trouble swallowing w/ his meal; pt also denied any trouble w/ his speech and stated he could recall family names and what he wanted to say. Pt appears at his baseline at this time. No further skilled ST services indicated currently. NSG to reconsult if any change in status or any need for education. Pt/NSG agreed.     Aspiration Risk   (reduced)    Diet Recommendation Age appropriate regular solids;Thin   Medication Administration: Whole meds with liquid Compensations: Slow rate;Small sips/bites    Other  Recommendations Recommended Consults:  (n/a) Oral Care Recommendations: Oral care BID;Patient independent with oral care   Follow Up Recommendations       Frequency and Duration        Pertinent Vitals/Pain denied    SLP Swallow Goals  n/a    Swallow Study Prior Functional Status  Type of Home: House (with a basement which he does not use) Available Help at Discharge: Family  General Date of Onset: 08/08/15 Other Pertinent Information: Pt is a 80 y.o. male who presents with TIA. Patient states that on the day of presentation the ED he had a few spots of skin cancer removed at dermatology clinic. Afterwards he felt "weak", with malaise. He went home, and try to eat but did not feel like eating very much. Later in the evening he developed some symptoms of confusion. He states he was unable to remember his daughter's name, and then he was unable to say clearly  the things that he wanted to say. He also was unable to read simple text. These symptoms lasted overall about an hour. Within that time his wife convinced him to come to the ED for evaluation. Initial workup in the ED largely benign, but tele-neurology recommended admission for workup for TIA. MRI revealed Atrophy with patchy supratentorial small vessel disease. No acute or subacute infarct noted. Stenosis noted. Pt verbally conversive; talkative. No expressive language deficits noted; no Aphasia noted. Pt denied any dysphagia.  Type of Study: Bedside swallow evaluation Previous Swallow Assessment: none Diet Prior to this Study: Regular;Thin liquids Temperature Spikes Noted: No (wbc 7.6) Respiratory Status: Room air History of Recent Intubation: No Behavior/Cognition: Alert;Cooperative;Pleasant mood Oral Cavity - Dentition: Adequate natural dentition/normal for age Self-Feeding Abilities: Able to feed self Patient Positioning:  (sitting EOB) Baseline Vocal Quality: Normal Volitional Cough: Strong Volitional Swallow: Able to elicit    Oral/Motor/Sensory Function Overall Oral Motor/Sensory Function: Appears within functional limits for tasks assessed Labial ROM: Within Functional Limits Labial Symmetry: Within Functional Limits Labial Strength: Within Functional Limits Lingual ROM: Within Functional Limits Lingual Symmetry: Within Functional Limits Lingual Strength: Within Functional Limits Facial Symmetry: Within Functional Limits Mandible: Within Functional Limits   Ice Chips Ice chips: Not tested Other Comments: pt eating his lunch meal   Thin Liquid Thin Liquid: Within functional limits Presentation: Cup;Self Fed Other Comments: ~2-3 ozs    Nectar Thick Nectar Thick Liquid: Not tested   Honey Thick Honey Thick Liquid: Not tested   Puree Puree: Within functional limits Presentation: Self Fed;Spoon Other Comments: 2 trials   Solid   GO    Solid: Within functional  limits Presentation: Self Fed;Spoon Other Comments: sandwich; chopped fruit; crackers; soup - multiple trials of each      Orinda Kenner, MS, CCC-SLP  Watson,Katherine 08/09/2015,3:18 PM

## 2015-08-09 NOTE — Evaluation (Signed)
Occupational Therapy Evaluation Patient Details Name: Roberto Adams MRN: [id number] DOB: 09-21-1935 Today's Date: 08/09/2015    History of Present Illness This patient is a 80 year old male who came to [facility] with slurred speech and confusion.   Clinical Impression   This patient is a 80 year old male who came to [facility]  with TIA. Per chart review the day he came to [facility]  ED he had a few spots of skin cancer removed at dermatology clinic. Afterwards he felt weak. He went home. Tried to eat but did not feel like eating. Later in the evening he developed some  confusion. Per chart he states he was unable to remember his daughter's name, and then he was unable to say clearly the things that he wanted to say. He also was unable to read. These symptoms lasted overall about an hour. Within that time his wife convinced him to come to [facility] ED for evaluation.  He is mildly impulsive and in the hospital would benefit from one or two session for safety during activities of daily living.    Follow Up Recommendations   No home Occupational Therapy    Equipment Recommendations       Recommendations for Other Services       Precautions / Restrictions Precautions Precautions: Fall Restrictions Weight Bearing Restrictions: No      Mobility Bed Mobility modified independent (using bed rails and extra time)                  Transfers Overall transfer level:  (bed to toilet and back contact guard, supiine to sit modified independent (used rails))                    Balance                                            ADL                                         General ADL Comments: Patient had been independent with basic ADL (dressing, bathing, toileting) and was driving. He demonstrated ability to dress self with extra time and contact guard assist to  supervision.      Vision     Perception     Praxis      Pertinent Vitals/Pain       Hand Dominance     Extremity/Trunk Assessment Upper Extremity Assessment Upper Extremity Assessment:  (Bilateral shoulders 4+/5, elbow flexion 5/5, extension, 4/5 wrist and forearm 5/5 (bilaterally for all) sensation normal for light touch, stereognosis, and temp.)   Lower Extremity Assessment Lower Extremity Assessment: Defer to PT evaluation       Communication     Cognition Arousal/Alertness: Awake/alert Behavior During Therapy:  (mildly impulsive) Overall Cognitive Status:  (Oriented to place, time not asked)                     General Comments       Exercises       Shoulder Instructions      Home Living Family/patient expects to be discharged to:: Private residence Living Arrangements: Spouse/significant other Available Help at Discharge: Family Type of Home:  House (with a basement which he does not use) Home Access: Stairs to enter [employer] of Steps: 4 Entrance Stairs-Rails: Left (assending) Home Layout: One level (with basement)               Home Equipment: Cane - single point (uses single point cane occasionally)          Prior Functioning/Environment Level of Independence: Independent (Independent with basic ADL (dressing/bathing/toiletin) needing assist with higher level )             OT Diagnosis: Altered mental status   OT Problem List: Decreased activity tolerance   OT Treatment/Interventions: Self-care/ADL training    OT Goals(Current goals can be found in the care plan section) Acute Rehab OT Goals Patient Stated Goal: wants to go home OT Goal Formulation: With patient/family Time For Goal Achievement: 08/23/15 Potential to Achieve Goals: Good  OT Frequency: Min 1X/week   Barriers to D/C:            Co-evaluation              End of Session Equipment Utilized During Treatment: Gait belt  Activity  Tolerance:   Patient left: in bed;with call bell/phone within reach;with bed alarm set;with family/visitor present   Time: 1425-1445 OT Time Calculation (min): 20 min Charges:  OT General Charges $OT Visit: 1 Procedure OT Evaluation $Initial OT Evaluation Tier I: 1 Procedure G-Codes:    Myrene Galas, MS/OTR/L  08/09/2015, 3:13 PM

## 2015-08-09 NOTE — H&P (Signed)
[place] [facility] [place] NAME: Roberto Adams    MR#:  [id number]  DATE OF BIRTH:  13-Dec-1934  DATE OF ADMISSION:  08/08/2015  PRIMARY CARE PHYSICIAN: Kirk Ruths., MD   REQUESTING/REFERRING PHYSICIAN: Kerman Passey, MD  CHIEF COMPLAINT:   Chief Complaint  Patient presents with  . Code Stroke    HISTORY OF PRESENT ILLNESS:  Roberto Adams  is a 80 y.o. male who presents with TIA. Patient states that on the day of presentation the ED he had a few spots of skin cancer removed at dermatology clinic. Afterwards he felt "weak", with malaise. He went home, and try to eat but did not feel like eating very much. Later in the evening he developed some symptoms of confusion. He states he was unable to remember his daughter's name, and then he was unable to say clearly the things that he wanted to say. He also was unable to read simple text. These symptoms lasted overall about an hour. Within that time his wife convinced him to come to the ED for evaluation. Initial workup in the ED largely benign, but tele-neurology recommended admission for workup for TIA. Hospitalist called for the same.  PAST MEDICAL HISTORY:   Past Medical History  Diagnosis Date  . Hypertension   . Cancer ([place])     skin  . History of lung cancer 2001    Rt upper lobe; s/p chemo and radiation with recurrence  . Enlarged prostate   . Nocturia associated with benign prostatic hypertrophy   . Arthritis   . Hearing aid worn     both ears  . CAD (coronary artery disease)   . HLD (hyperlipidemia)     PAST SURGICAL HISTORY:   Past Surgical History  Procedure Laterality Date  . Pilonidal cyst / sinus excision  1958  . Rotator cuff repair  [id number]  . Lung cancer surgery Right 2001  . Epidural block injection    . Lumbar laminectomy/decompression microdiscectomy Right 01/17/2013    Procedure: Right Lumbar Two-three,Lumbar three-four,Lumbar Four-Five Decompressive  Lumbar laminectomy;  Surgeon: Charlie Pitter, MD;  Location: [facility];  Service: Neurosurgery;  Laterality: Right;  right    SOCIAL HISTORY:   Social History  Substance Use Topics  . Smoking status: Former Smoker -- 1.00 packs/day for 35 years    Types: Cigarettes  . Smokeless tobacco: Not on file     Comment: quit smoking 1992  . Alcohol Use: No    FAMILY HISTORY:   Family History  Problem Relation Age of Onset  . Alzheimer's disease Mother   . Alcohol abuse Father   . Heart disease Father   . Prostate cancer Brother     DRUG ALLERGIES:  No Known Allergies  MEDICATIONS AT HOME:   Prior to Admission medications   Medication Sig Start Date End Date Taking? Authorizing Provider  aspirin 81 MG tablet Take 81 mg by mouth daily.   Yes Historical Provider, MD  lisinopril (PRINIVIL,ZESTRIL) 40 MG tablet Take 40 mg by mouth daily.   Yes Historical Provider, MD    REVIEW OF SYSTEMS:  Review of Systems  Constitutional: Negative for fever, chills, weight loss and malaise/fatigue.  HENT: Negative for ear pain, hearing loss and tinnitus.   Eyes: Negative for blurred vision, double vision, pain and redness.  Respiratory: Negative for cough, hemoptysis and shortness of breath.   Cardiovascular: Negative for chest pain, palpitations, orthopnea and leg swelling.  Gastrointestinal: Negative for nausea,  vomiting, abdominal pain, diarrhea and constipation.  Genitourinary: Negative for dysuria, frequency and hematuria.  Musculoskeletal: Negative for back pain, joint pain and neck pain.  Skin:       No acne, rash, or lesions  Neurological: Positive for speech change (expressive aphasia). Negative for dizziness, tremors, focal weakness and weakness.       Alexia  Endo/Heme/Allergies: Negative for polydipsia. Does not bruise/bleed easily.  Psychiatric/Behavioral: Negative for depression. The patient is not nervous/anxious and does not have insomnia.      VITAL SIGNS:   Filed Vitals:    08/08/15 2200 08/08/15 2230 08/08/15 2300 08/09/15 0009  BP: 146/84   150/90  Pulse: 99 91 94 95  Temp:      TempSrc:      Resp: '19 15 17 18  '$ Weight:      SpO2: 97% 98% 98% 97%   Wt Readings from Last 3 Encounters:  08/08/15 95.255 kg (210 lb)  01/13/13 97.569 kg (215 lb 1.6 oz)    PHYSICAL EXAMINATION:  Physical Exam  Vitals reviewed. Constitutional: He is oriented to person, place, and time. He appears well-developed and well-nourished. No distress.  HENT:  Head: Normocephalic and atraumatic.  Mouth/Throat: Oropharynx is clear and moist.  Eyes: Conjunctivae and EOM are normal. Pupils are equal, round, and reactive to light. No scleral icterus.  Neck: Normal range of motion. Neck supple. No JVD present. No thyromegaly present.  Cardiovascular: Normal rate, regular rhythm and intact distal pulses.  Exam reveals no gallop and no friction rub.   No murmur heard. Respiratory: Effort normal and breath sounds normal. No respiratory distress. He has no wheezes. He has no rales.  GI: Soft. Bowel sounds are normal. He exhibits no distension. There is no tenderness.  Musculoskeletal: Normal range of motion. He exhibits no edema.  No arthritis, no gout  Lymphadenopathy:    He has no cervical adenopathy.  Neurological: He is alert and oriented to person, place, and time. No cranial nerve deficit.  Neurologic: Cranial nerves II-XII intact, Sensation intact to light touch/pinprick, 5/5 strength in all extremities, no dysarthria, no aphasia, no dysphagia, memory intact, finger to nose testing showed no abnormality, no pronator drift, DTR intact, Babinski sign not present.  Skin: Skin is warm and dry. No rash noted. No erythema.  Psychiatric: He has a normal mood and affect. His behavior is normal. Judgment and thought content normal.    LABORATORY PANEL:   CBC  Recent Labs Lab 08/08/15 2226  WBC 7.5  HGB 13.9  HCT 41.2  PLT 205    ------------------------------------------------------------------------------------------------------------------  Chemistries   Recent Labs Lab 08/08/15 2124  NA 137  K 3.3*  CL 102  CO2 25  GLUCOSE 108*  BUN 17  CREATININE 1.11  CALCIUM 9.1  AST 33  ALT 25  ALKPHOS 82  BILITOT 0.6   ------------------------------------------------------------------------------------------------------------------  Cardiac Enzymes  Recent Labs Lab 08/08/15 2226  TROPONINI <0.03   ------------------------------------------------------------------------------------------------------------------  RADIOLOGY:  Ct Head Wo Contrast  08/08/2015  CLINICAL DATA:  Confusion with dysphasia EXAM: CT HEAD WITHOUT CONTRAST TECHNIQUE: Contiguous axial images were obtained from the base of the skull through the vertex without intravenous contrast. COMPARISON:  Brain MRI September 22, 2011 FINDINGS: Moderate diffuse atrophy is stable. There is no intracranial mass, hemorrhage, extra-axial fluid collection, or midline shift. There is patchy small vessel disease in the centra semiovale bilaterally. Elsewhere gray-white compartments appear normal. No acute infarct is appreciable. The middle cerebral arteries show symmetric attenuation bilaterally. Bony calvarium  appears intact. The mastoid air cells are clear. IMPRESSION: Atrophy with patchy supratentorial small vessel disease. No acute infarct evident. No hemorrhage or mass effect. Critical Value/emergent results were called by telephone at the time of interpretation on 08/08/2015 at 9:14 pm to Dr. Harvest Dark , who verbally acknowledged these results. Electronically Signed   By: Lowella Grip III M.D.   On: 08/08/2015 21:16    EKG:   Orders placed or performed during the hospital encounter of 08/08/15  . ED EKG  . ED EKG    IMPRESSION AND PLAN:  Principal Problem:   TIA (transient ischemic attack) - symptoms of expressive aphasia and anorexia  have completely resolved. Vision had no motor or sensory symptoms to begin with. We'll admit for MRI/MRA brain, fasting lipid panel, ultrasound carotids, and neurology consult. Active Problems:   HTN (hypertension) - permissive hypertension first 24 hours after onset of symptoms in case he actually had a small stroke.  Goal blood pressure less than 220/110.   CAD (coronary artery disease) - continue home medications   HLD (hyperlipidemia) - continue home meds  All the records are reviewed and case discussed with ED provider. Management plans discussed with the patient and/or family.  DVT PROPHYLAXIS: SubQ lovenox  ADMISSION STATUS: Inpatient  CODE STATUS: DO NOT RESUSCITATE  TOTAL TIME TAKING CARE OF THIS PATIENT: 45 minutes.    Twanda Stakes [place] 08/09/2015, 12:27 AM  [facility]  Office  [phone]  CC: Primary care physician; Kirk Ruths., MD

## 2015-08-09 NOTE — Progress Notes (Addendum)
MD making rounds. Discharge orders received. Appointments scheduled. Telemetry Removed. IV removed. Prescriptions given to patient. Discharge paperwork provided, explained, signed and witnessed. Education handouts provided to patient. No unanswered questions. Escorted via wheelchair by nursing staff. All belongings sent with patient and family.

## 2015-08-09 NOTE — Plan of Care (Signed)
Problem: Discharge/Transitional Outcomes Goal: Barriers To Progression Addressed/Resolved Outcome: Progressing Pt lives at home with spouse Hx of HTN arthritis, CAD, HLD, continue home medications. No history of prior stroke High Fall Risk, DNR Goal: Other Discharge Outcomes/Goals Outcome: Progressing Pt is alert and oriented, standby assist to BR. No c/o pain. C/o nausea x1, Zofran given. NSR on telemetry. NIH scale score of 0. Neuro checks q2 hours until 1330 today.

## 2015-08-20 DIAGNOSIS — G454 Transient global amnesia: Secondary | ICD-10-CM

## 2015-08-20 HISTORY — DX: Transient global amnesia: G45.4

## 2015-10-19 ENCOUNTER — Emergency Department
Admission: EM | Admit: 2015-10-19 | Discharge: 2015-10-19 | Disposition: A | Discharge status: Home / Self Care | Payer: Medicare Other | Attending: Emergency Medicine | Admitting: Emergency Medicine

## 2015-10-19 ENCOUNTER — Encounter: Payer: Self-pay | Admitting: Emergency Medicine

## 2015-10-19 DIAGNOSIS — R339 Retention of urine, unspecified: Secondary | ICD-10-CM

## 2015-10-19 DIAGNOSIS — Z7982 Long term (current) use of aspirin: Secondary | ICD-10-CM | POA: Insufficient documentation

## 2015-10-19 DIAGNOSIS — I10 Essential (primary) hypertension: Secondary | ICD-10-CM | POA: Insufficient documentation

## 2015-10-19 DIAGNOSIS — Z79899 Other long term (current) drug therapy: Secondary | ICD-10-CM | POA: Insufficient documentation

## 2015-10-19 DIAGNOSIS — R1030 Lower abdominal pain, unspecified: Secondary | ICD-10-CM | POA: Diagnosis not present

## 2015-10-19 DIAGNOSIS — Z7902 Long term (current) use of antithrombotics/antiplatelets: Secondary | ICD-10-CM | POA: Insufficient documentation

## 2015-10-19 DIAGNOSIS — Z87891 Personal history of nicotine dependence: Secondary | ICD-10-CM | POA: Insufficient documentation

## 2015-10-19 LAB — BASIC METABOLIC PANEL
Anion gap: 8 (ref 5–15)
BUN: 14 mg/dL (ref 6–20)
CALCIUM: 9.2 mg/dL (ref 8.9–10.3)
CHLORIDE: 101 mmol/L (ref 101–111)
CO2: 29 mmol/L (ref 22–32)
Creatinine, Ser: 1.13 mg/dL (ref 0.61–1.24)
Glucose, Bld: 105 mg/dL — ABNORMAL HIGH (ref 65–99)
Potassium: 4 mmol/L (ref 3.5–5.1)
SODIUM: 138 mmol/L (ref 135–145)

## 2015-10-19 LAB — URINALYSIS COMPLETE WITH MICROSCOPIC (ARMC ONLY)
BACTERIA UA: NONE SEEN
Bilirubin Urine: NEGATIVE
Glucose, UA: NEGATIVE mg/dL
KETONES UR: NEGATIVE mg/dL
Leukocytes, UA: NEGATIVE
NITRITE: NEGATIVE
PH: 6 (ref 5.0–8.0)
PROTEIN: NEGATIVE mg/dL
SPECIFIC GRAVITY, URINE: 1.006 (ref 1.005–1.030)
Squamous Epithelial / LPF: NONE SEEN
WBC UA: NONE SEEN WBC/hpf (ref 0–5)

## 2015-10-19 LAB — CBC
HEMATOCRIT: 43.1 % (ref 40.0–52.0)
HEMOGLOBIN: 14.3 g/dL (ref 13.0–18.0)
MCH: 27.8 pg (ref 26.0–34.0)
MCHC: 33.3 g/dL (ref 32.0–36.0)
MCV: 83.6 fL (ref 80.0–100.0)
Platelets: 209 10*3/uL (ref 150–440)
RBC: 5.15 MIL/uL (ref 4.40–5.90)
RDW: 15.2 % — ABNORMAL HIGH (ref 11.5–14.5)
WBC: 12.3 10*3/uL — ABNORMAL HIGH (ref 3.8–10.6)

## 2015-10-19 NOTE — Discharge Instructions (Signed)
Keep the Foley catheter in place for the next 4-5 days. Call to arrange for an appointment to see the urologist this coming week for removal of the catheter and for further evaluation. If you have any difficulties, if urine does not to the Foley catheter, if you have any fevers, or if he have other urgent concerns, return to the emergency department.  Acute Urinary Retention, Male Acute urinary retention is the temporary inability to urinate. This is a common problem in older men. As men age their prostates become larger and block the flow of urine from the bladder. This is usually a problem that has come on gradually.  HOME CARE INSTRUCTIONS If you are sent home with a Foley catheter and a drainage system, you will need to discuss the best course of action with your health care provider. While the catheter is in, maintain a good intake of fluids. Keep the drainage bag emptied and lower than your catheter. This is so that contaminated urine will not flow back into your bladder, which could lead to a urinary tract infection. There are two main types of drainage bags. One is a large bag that usually is used at night. It has a good capacity that will allow you to sleep through the night without having to empty it. The second type is called a leg bag. It has a smaller capacity, so it needs to be emptied more frequently. However, the main advantage is that it can be attached by a leg strap and can go underneath your clothing, allowing you the freedom to move about or leave your home. Only take over-the-counter or prescription medicines for pain, discomfort, or fever as directed by your health care provider.  SEEK MEDICAL CARE IF:  You develop a low-grade fever.  You experience spasms or leakage of urine with the spasms. SEEK IMMEDIATE MEDICAL CARE IF:   You develop chills or fever.  Your catheter stops draining urine.  Your catheter falls out.  You start to develop increased bleeding that does not  respond to rest and increased fluid intake. MAKE SURE YOU:  Understand these instructions.  Will watch your condition.  Will get help right away if you are not doing well or get worse.   This information is not intended to replace advice given to you by your health care provider. Make sure you discuss any questions you have with your health care provider.   Document Released: 01/05/2001 Document Revised: 02/13/2015 Document Reviewed: 03/10/2013 Elsevier Interactive Patient Education [employer].

## 2015-10-19 NOTE — ED Provider Notes (Signed)
[facility] Emergency Department Provider Note  ____________________________________________  Time seen: 1405  I have reviewed the triage vital signs and the nursing notes.  History by:  Patient  HISTORY  Chief Complaint Urinary Retention     HPI Roberto Adams is a 81 y.o. male who reports he will this morning unable to urinate. He has had pressure and discomfort in his lower abdomen. He was concerned he might have a kidney stone given the degree of discomfort he had. He reports sometimes he has to wake up twice in the middle the night to urinate. Other than this, he has no history of significant urinary retention.    Past Medical History  Diagnosis Date  . Hypertension   . Cancer ([place])     skin  . History of lung cancer 2001    Rt upper lobe; s/p chemo and radiation with recurrence  . Enlarged prostate   . Nocturia associated with benign prostatic hypertrophy   . Arthritis   . Hearing aid worn     both ears  . CAD (coronary artery disease)   . HLD (hyperlipidemia)     Patient Active Problem List   Diagnosis Date Noted  . TIA (transient ischemic attack) 08/08/2015  . HTN (hypertension) 08/08/2015  . CAD (coronary artery disease) 08/08/2015  . HLD (hyperlipidemia) 08/08/2015  . Spinal stenosis, lumbar region, with neurogenic claudication 01/17/2013    Past Surgical History  Procedure Laterality Date  . Pilonidal cyst / sinus excision  1958  . Rotator cuff repair  [id number]  . Lung cancer surgery Right 2001  . Epidural block injection    . Lumbar laminectomy/decompression microdiscectomy Right 01/17/2013    Procedure: Right Lumbar Two-three,Lumbar three-four,Lumbar Four-Five Decompressive Lumbar laminectomy;  Surgeon: Charlie Pitter, MD;  Location: [facility];  Service: Neurosurgery;  Laterality: Right;  right    Current Outpatient Rx  Name  Route  Sig  Dispense  Refill  . aspirin 81 MG tablet   Oral   Take 81 mg by mouth daily.          Marland Kitchen atorvastatin (LIPITOR) 40 MG tablet   Oral   Take 1 tablet (40 mg total) by mouth daily at 6 PM.   30 tablet   0   . clopidogrel (PLAVIX) 75 MG tablet   Oral   Take 1 tablet (75 mg total) by mouth daily.   90 tablet   0   . lisinopril (PRINIVIL,ZESTRIL) 40 MG tablet   Oral   Take 40 mg by mouth daily.           Allergies Review of patient's allergies indicates no known allergies.  Family History  Problem Relation Age of Onset  . Alzheimer's disease Mother   . Alcohol abuse Father   . Heart disease Father   . Prostate cancer Brother     Social History Social History  Substance Use Topics  . Smoking status: Former Smoker -- 1.00 packs/day for 35 years    Types: Cigarettes  . Smokeless tobacco: None     Comment: quit smoking 1992  . Alcohol Use: No    Review of Systems  Constitutional: Negative for fever/chills. ENT: Negative for congestion. Cardiovascular: Negative for chest pain. Respiratory: Negative for cough. Gastrointestinal: Negative for abdominal pain, vomiting and diarrhea. Genitourinary: Unable to void. See history of present illness Musculoskeletal: No back pain. Skin: Negative for rash. Neurological: Negative for headache or focal weakness   10-point ROS otherwise negative.  ____________________________________________   PHYSICAL EXAM:  VITAL SIGNS: ED Triage Vitals  Enc Vitals Group     BP 10/19/15 1011 138/76 mmHg     Pulse Rate 10/19/15 1011 98     Resp 10/19/15 1011 18     Temp 10/19/15 1011 98 F (36.7 C)     Temp Source 10/19/15 1011 Oral     SpO2 10/19/15 1011 97 %     Weight 10/19/15 1011 212 lb (96.163 kg)     Height 10/19/15 1011 '5\' 5"'$  (1.651 m)     Head Cir --      Peak Flow --      Pain Score 10/19/15 1011 6     Pain Loc --      Pain Edu? --      Excl. in [place]? --     Constitutional: Alert and oriented. Well appearing and in no distress. Patient is Roberto had a Foley placed and has 1000 mL's of urine  out. ENT   Head: Normocephalic and atraumatic. Cardiovascular: Normal rate, regular rhythm, no murmur noted Respiratory:  Normal respiratory effort, no tachypnea.    Breath sounds are clear and equal bilaterally.  Gastrointestinal: Soft, no distention. Nontender. No bladder distention. Genitals: Normal appearing male genitalia. Foley catheter now in place. There is a scant amount of blood at the meatus. Back: No muscle spasm, no tenderness, no CVA tenderness. Musculoskeletal: No deformity noted. Nontender with normal range of motion in all extremities.  No noted edema. Neurologic:  Communicative. Normal appearing spontaneous movement in all 4 extremities. No gross focal neurologic deficits are appreciated.  Skin:  Skin is warm, dry. No rash noted. Psychiatric: Mood and affect are normal. Speech and behavior are normal.  ____________________________________________    LABS (pertinent positives/negatives)  Labs Reviewed  BASIC METABOLIC PANEL - Abnormal; Notable for the following:    Glucose, Bld 105 (*)    All other components within normal limits  CBC - Abnormal; Notable for the following:    WBC 12.3 (*)    RDW 15.2 (*)    All other components within normal limits  URINALYSIS COMPLETEWITH MICROSCOPIC ([facility] ONLY) - Abnormal; Notable for the following:    Color, Urine YELLOW (*)    APPearance CLEAR (*)    Hgb urine dipstick 3+ (*)    All other components within normal limits     ____________________________________________ ____________________________________________   INITIAL IMPRESSION / ASSESSMENT AND PLAN / ED COURSE  Pertinent labs & imaging results that were available during my care of the patient were reviewed by me and considered in my medical decision making (see chart for details).  Mr. Adams is a 81 year old male with noted urinary retention today. He had notable discomfort. While a bladder scan had been ordered, not sure if it was completed or what the reading  was. The patient did have a Foley catheter placed with a total of nearly 1000 ml's of urine.  Labs and urinalysis are pending. Presuming that they are all right, we will discharge him home with the Foley catheter in place to follow with urology. I discussed this with the patient. He understands and agrees with the plan.  ____________________________________________   FINAL CLINICAL IMPRESSION(S) / ED DIAGNOSES  Final diagnoses:  Urinary retention      Ahmed Prima, MD 10/19/15 1451

## 2015-10-19 NOTE — ED Notes (Signed)
Brought in via family with urinary retention   Last time he felt like he emptied his bladder was last pm

## 2015-10-23 ENCOUNTER — Encounter: Payer: Self-pay | Admitting: *Deleted

## 2015-10-24 ENCOUNTER — Encounter: Payer: Self-pay | Admitting: Obstetrics and Gynecology

## 2015-10-24 ENCOUNTER — Ambulatory Visit (INDEPENDENT_AMBULATORY_CARE_PROVIDER_SITE_OTHER): Payer: Medicare Other | Admitting: Obstetrics and Gynecology

## 2015-10-24 ENCOUNTER — Telehealth: Payer: Self-pay | Admitting: Obstetrics and Gynecology

## 2015-10-24 ENCOUNTER — Ambulatory Visit: Payer: Self-pay | Admitting: Obstetrics and Gynecology

## 2015-10-24 VITALS — BP 133/81 | HR 92 | Resp 18 | Ht 65.0 in | Wt 205.1 lb

## 2015-10-24 DIAGNOSIS — R339 Retention of urine, unspecified: Secondary | ICD-10-CM

## 2015-10-24 DIAGNOSIS — N400 Enlarged prostate without lower urinary tract symptoms: Secondary | ICD-10-CM | POA: Diagnosis not present

## 2015-10-24 MED ORDER — FINASTERIDE 5 MG PO TABS: 5.0000 mg | ORAL_TABLET | Freq: Every day | ORAL | Status: DC

## 2015-10-24 MED ORDER — TAMSULOSIN HCL 0.4 MG PO CAPS: 0.4000 mg | ORAL_CAPSULE | Freq: Every day | ORAL | Status: DC

## 2015-10-24 NOTE — Progress Notes (Signed)
10/24/2015 1:25 PM   Roberto Adams May 10, 1935 [id number]  Referring provider: Kirk Ruths, MD [address], [place] [address]  Chief Complaint  Patient presents with  . Urinary Retention    Foley cath removal  . Establish Care    HPI: Patient is a 81 year old male with a hisotry of HTN, CAD, TIA, BPH, HLD presenting today for follow-up after being seen in the emergency department on 10/19/15 for an acute episode of urinary retention. He reports one prior episode of urinary retention post lung surgery years ago. He reports no symptoms of urinary frequency, urgency, hesitancy, gross hematuria or sensation of incomplete bladder emptying at baseline. He does report nocturia twice per night on average  He reports that he has been told he had an enlarged prostate by his primary care provider in the past.  He has taken saw palmetto but no prescription medications for BPH.     S/p per patient CVA October 2017.  Former smoker 1 ppd x 40 years.   PMH: Past Medical History  Diagnosis Date  . Hypertension   . Cancer ([place])     skin  . History of lung cancer 2001    Rt upper lobe; s/p chemo and radiation with recurrence  . Enlarged prostate   . Nocturia associated with benign prostatic hypertrophy   . Arthritis   . Hearing aid worn     both ears  . CAD (coronary artery disease)   . HLD (hyperlipidemia)     Surgical History: Past Surgical History  Procedure Laterality Date  . Pilonidal cyst / sinus excision  1958  . Rotator cuff repair  [id number]  . Lung cancer surgery Right 2001  . Epidural block injection    . Lumbar laminectomy/decompression microdiscectomy Right 01/17/2013    Procedure: Right Lumbar Two-three,Lumbar three-four,Lumbar Four-Five Decompressive Lumbar laminectomy;  Surgeon: Charlie Pitter, MD;  Location: [facility];  Service: Neurosurgery;  Laterality: Right;  right    Home Medications:    Medication List       This  list is accurate as of: 10/24/15  1:25 PM.  Always use your most recent med list.               aspirin 81 MG tablet  Take 81 mg by mouth daily.     atorvastatin 40 MG tablet  Commonly known as:  LIPITOR  Take 1 tablet (40 mg total) by mouth daily at 6 PM.     clopidogrel 75 MG tablet  Commonly known as:  PLAVIX  Take 1 tablet (75 mg total) by mouth daily.     lisinopril-hydrochlorothiazide 20-25 MG tablet  Commonly known as:  PRINZIDE,ZESTORETIC     traZODone 50 MG tablet  Commonly known as:  DESYREL        Allergies:  Allergies  Allergen Reactions  . Statins     Other reaction(s): Muscle Pain    Family History: Family History  Problem Relation Age of Onset  . Alzheimer's disease Mother   . Alcohol abuse Father   . Heart disease Father   . Prostate cancer Brother     Social History:  reports that he has quit smoking. His smoking use included Cigarettes. He has a 35 pack-year smoking history. He does not have any smokeless tobacco history on file. He reports that he does not drink alcohol or use illicit drugs.  ROS: UROLOGY Frequent Urination?: Yes Hard to postpone urination?: No Burning/pain  with urination?: No Get up at night to urinate?: Yes Leakage of urine?: No Urine stream starts and stops?: No Trouble starting stream?: Yes Do you have to strain to urinate?: No Blood in urine?: No Urinary tract infection?: Yes Sexually transmitted disease?: No Injury to kidneys or bladder?: No Painful intercourse?: No Weak stream?: No Erection problems?: Yes Penile pain?: No  Gastrointestinal Nausea?: No Vomiting?: No Indigestion/heartburn?: No Diarrhea?: Yes Constipation?: No  Constitutional Fever: No Night sweats?: No Weight loss?: No Fatigue?: No  Skin Skin rash/lesions?: Yes Itching?: Yes  Eyes Blurred vision?: No Double vision?: No  Ears/Nose/Throat Sore throat?: No Sinus problems?: Yes  Hematologic/Lymphatic Swollen glands?: No Easy  bruising?: No  Cardiovascular Leg swelling?: No Chest pain?: No  Respiratory Cough?: Yes Shortness of breath?: No  Endocrine Excessive thirst?: No  Musculoskeletal Back pain?: Yes Joint pain?: Yes  Neurological Headaches?: No Dizziness?: No  Psychologic Depression?: No Anxiety?: No  Physical Exam: BP 133/81 mmHg  Pulse 92  Resp 18  Ht '5\' 5"'$  (1.651 m)  Wt 205 lb 1.6 oz (93.033 kg)  BMI 34.13 kg/m2  Constitutional:  Alert and oriented, No acute distress. HEENT: [place] AT, moist mucus membranes.  Trachea midline, no masses. Cardiovascular: No clubbing, cyanosis, or edema. Respiratory: Normal respiratory effort, no increased work of breathing. GI: Abdomen is soft, nontender, nondistended, no abdominal masses GU: No CVA tenderness, uncircumcised phallus, Foley catheter present in the urinary meatus draining clear yellow urine, testicles descended bilaterally, no masses DRE: +3 prostate, smooth, slightly firm Skin: No rashes, bruises or suspicious lesions. Lymph: No cervical or inguinal adenopathy. Neurologic: Grossly intact, no focal deficits, moving all 4 extremities. Psychiatric: Normal mood and affect.  Laboratory Data: Results for orders placed or performed during the hospital encounter of 53/61/44  Basic metabolic panel  Result Value Ref Range   Sodium 138 135 - 145 mmol/L   Potassium 4.0 3.5 - 5.1 mmol/L   Chloride 101 101 - 111 mmol/L   CO2 29 22 - 32 mmol/L   Glucose, Bld 105 (H) 65 - 99 mg/dL   BUN 14 6 - 20 mg/dL   Creatinine, Ser 1.13 0.61 - 1.24 mg/dL   Calcium 9.2 8.9 - 10.3 mg/dL   GFR calc non Af Amer >60 >60 mL/min   GFR calc Af Amer >60 >60 mL/min   Anion gap 8 5 - 15  CBC  Result Value Ref Range   WBC 12.3 (H) 3.8 - 10.6 K/uL   RBC 5.15 4.40 - 5.90 MIL/uL   Hemoglobin 14.3 13.0 - 18.0 g/dL   HCT 43.1 40.0 - 52.0 %   MCV 83.6 80.0 - 100.0 fL   MCH 27.8 26.0 - 34.0 pg   MCHC 33.3 32.0 - 36.0 g/dL   RDW 15.2 (H) 11.5 - 14.5 %   Platelets 209  150 - 440 K/uL  Urinalysis complete, with microscopic- may I&O cath if menses ([facility] only)  Result Value Ref Range   Color, Urine YELLOW (A) YELLOW   APPearance CLEAR (A) CLEAR   Glucose, UA NEGATIVE NEGATIVE mg/dL   Bilirubin Urine NEGATIVE NEGATIVE   Ketones, ur NEGATIVE NEGATIVE mg/dL   Specific Gravity, Urine 1.006 1.005 - 1.030   Hgb urine dipstick 3+ (A) NEGATIVE   pH 6.0 5.0 - 8.0   Protein, ur NEGATIVE NEGATIVE mg/dL   Nitrite NEGATIVE NEGATIVE   Leukocytes, UA NEGATIVE NEGATIVE   RBC / HPF TOO NUMEROUS TO COUNT 0 - 5 RBC/hpf   WBC, UA NONE SEEN  0 - 5 WBC/hpf   Bacteria, UA NONE SEEN NONE SEEN   Squamous Epithelial / LPF NONE SEEN NONE SEEN   Mucous PRESENT     Pertinent Imaging:   Assessment & Plan:    1. Urinary retention- S/p acute urinary retention with approximately 104m initial ouput 5 days ago.  Patient is recommended to wait approximately one week after Foley catheter insertion to ensure that the bladder has been adequately decompress and he is more likely to be successful with his voiding trial. I also would like to start him on Flomax prior to Foley catheter removal. Prescription sent to his pharmacy and patient will return in 3 days for a voiding trial. He may also require CIC teaching. I discussed this with him today briefly.  2. BPH with LUTS- Prostate significant currently enlarged on exam. This is patient's second episode of urinary retention. I sent him prescription for Flomax as well as finasteride to be started today. We will recheck symptoms in 3 months.  After patient left the office it was noted that he has a history of elevated PSA.  I am unable to find lab documentation of this in available notes.   We will request further information from patient when he returns for his voiding trial.  There are no diagnoses linked to this encounter.  Return for Friday early am RN visit for voiding trial/CIC teaching.  These notes generated with voice recognition  software. I apologize for typographical errors.  LHerbert Moors [facility] [address] [place] [phone]

## 2015-10-24 NOTE — Telephone Encounter (Signed)
If he passes his voiding trial we will also please make sure that he has an appointment in 3 months for follow-up for BPH. Thanks

## 2015-10-24 NOTE — Telephone Encounter (Signed)
When this patient comes in for a voiding trial on Friday we please ask him about his previous PSAs. I see it documented that he has a history of elevated PSAs that he did not mention during his appointment. I would like to see what his baseline levels are. We please have him sign a release form to require these labs.  thanks

## 2015-10-26 ENCOUNTER — Ambulatory Visit (INDEPENDENT_AMBULATORY_CARE_PROVIDER_SITE_OTHER): Payer: Medicare Other

## 2015-10-26 DIAGNOSIS — R339 Retention of urine, unspecified: Secondary | ICD-10-CM

## 2015-10-26 NOTE — Progress Notes (Signed)
Continuous Intermittent Catheterization  Due to urinary retention patient is present today for a teaching of self I & O Catheterization. Patient was given detailed verbal and printed instructions of self catheterization. Patient was cleaned and prepped in a sterile fashion.  With instruction and assistance patient inserted a 14FR and urine return was noted 0 ml. Patient tolerated well, no complications were noted Patient was given a sample bag with supplies to take home.  Instructions were given per Ria Comment for patient to cath 4 times daily, if not able to urinate on their own.  Pt will follow up in 2 weeks with Ria Comment.   Preformed by: Toniann Fail, LPN  Additional Notes: pt came in for a voiding trial and was not able to tolerated the water being instilled into the bladder. 24f foley was removed without any complications. Pt will call next week when he gets back in [place] to [place] if he is able to urinate on his own.

## 2015-11-01 ENCOUNTER — Telehealth: Payer: Self-pay

## 2015-11-01 NOTE — Telephone Encounter (Signed)
Pt wife called this morning stating that pt is getting a lot of blood when performing CIC and his testicles are really really red. Please advise.

## 2015-11-01 NOTE — Telephone Encounter (Signed)
Spoke with pt wife and made aware pt needs to be seen. Wife voiced understanding. Pt will RTC tomorrow to see Ria Comment.

## 2015-11-01 NOTE — Telephone Encounter (Signed)
He needs to come in to be seen.

## 2015-11-02 ENCOUNTER — Encounter: Payer: Self-pay | Admitting: Obstetrics and Gynecology

## 2015-11-02 ENCOUNTER — Ambulatory Visit (INDEPENDENT_AMBULATORY_CARE_PROVIDER_SITE_OTHER): Payer: Medicare Other | Admitting: Obstetrics and Gynecology

## 2015-11-02 VITALS — BP 123/95 | HR 125 | Resp 16 | Ht 65.0 in | Wt 201.1 lb

## 2015-11-02 DIAGNOSIS — R310 Gross hematuria: Secondary | ICD-10-CM | POA: Diagnosis not present

## 2015-11-02 DIAGNOSIS — R339 Retention of urine, unspecified: Secondary | ICD-10-CM | POA: Diagnosis not present

## 2015-11-02 DIAGNOSIS — N400 Enlarged prostate without lower urinary tract symptoms: Secondary | ICD-10-CM | POA: Diagnosis not present

## 2015-11-02 LAB — URINALYSIS, COMPLETE
Bilirubin, UA: NEGATIVE
GLUCOSE, UA: NEGATIVE
Ketones, UA: NEGATIVE
Nitrite, UA: POSITIVE — AB
Specific Gravity, UA: 1.025 (ref 1.005–1.030)
UUROB: 1 mg/dL (ref 0.2–1.0)
pH, UA: 5.5 (ref 5.0–7.5)

## 2015-11-02 LAB — MICROSCOPIC EXAMINATION
Epithelial Cells (non renal): NONE SEEN /hpf (ref 0–10)
RBC, UA: >30 /hpf — ABNORMAL HIGH (ref 0–?)

## 2015-11-02 NOTE — Patient Instructions (Addendum)
Perform catheterization 3-4 times daily.   Cystoscopy Cystoscopy is a procedure that is used to help your caregiver diagnose and sometimes treat conditions that affect your lower urinary tract. Your lower urinary tract includes your bladder and the tube through which urine passes from your bladder out of your body (urethra). Cystoscopy is performed with a thin, tube-shaped instrument (cystoscope). The cystoscope has lenses and a light at the end so that your caregiver can see inside your bladder. The cystoscope is inserted at the entrance of your urethra. Your caregiver guides it through your urethra and into your bladder. There are two main types of cystoscopy:  Flexible cystoscopy (with a flexible cystoscope).  Rigid cystoscopy (with a rigid cystoscope). Cystoscopy may be recommended for many conditions, including:  Urinary tract infections.  Blood in your urine (hematuria).  Loss of bladder control (urinary incontinence) or overactive bladder.  Unusual cells found in a urine sample.  Urinary blockage.  Painful urination. Cystoscopy may also be done to remove a sample of your tissue to be checked under a microscope (biopsy). It may also be done to remove or destroy bladder stones. LET YOUR CAREGIVER KNOW ABOUT:  Allergies to food or medicine.  Medicines taken, including vitamins, herbs, eyedrops, over-the-counter medicines, and creams.  Use of steroids (by mouth or creams).  Previous problems with anesthetics or numbing medicines.  History of bleeding problems or blood clots.  Previous surgery.  Other health problems, including diabetes and kidney problems.  Possibility of pregnancy, if this applies. PROCEDURE The area around the opening to your urethra will be cleaned. A medicine to numb your urethra (local anesthetic) is used. If a tissue sample or stone is removed during the procedure, you may be given a medicine to make you sleep (general anesthetic). Your caregiver  will gently insert the tip of the cystoscope into your urethra. The cystoscope will be slowly glided through your urethra and into your bladder. Sterile fluid will flow through the cystoscope and into your bladder. The fluid will expand and stretch your bladder. This gives your caregiver a better view of your bladder walls. The procedure lasts about 15-20 minutes. AFTER THE PROCEDURE If a local anesthetic is used, you will be allowed to go home as soon as you are ready. If a general anesthetic is used, you will be taken to a recovery area until you are stable. You may have temporary bleeding and burning on urination.   This information is not intended to replace advice given to you by your health care provider. Make sure you discuss any questions you have with your health care provider.   Document Released: 09/26/2000 Document Revised: 10/20/2014 Document Reviewed: 03/22/2012 Elsevier Interactive Patient Education 2016 Elsevier Inc. Hematuria, Adult Hematuria is blood in your urine. It can be caused by a bladder infection, kidney infection, prostate infection, kidney stone, or cancer of your urinary tract. Infections can usually be treated with medicine, and a kidney stone usually will pass through your urine. If neither of these is the cause of your hematuria, further workup to find out the reason may be needed. It is very important that you tell your health care provider about any blood you see in your urine, even if the blood stops without treatment or happens without causing pain. Blood in your urine that happens and then stops and then happens again can be a symptom of a very serious condition. Also, pain is not a symptom in the initial stages of many urinary cancers. HOME CARE INSTRUCTIONS  Drink lots of fluid, 3-4 quarts a day. If you have been diagnosed with an infection, cranberry juice is especially recommended, in addition to large amounts of water.  Avoid caffeine, tea, and carbonated  beverages because they tend to irritate the bladder.  Avoid alcohol because it may irritate the prostate.  Take all medicines as directed by your health care provider.  If you were prescribed an antibiotic medicine, finish it all even if you start to feel better.  If you have been diagnosed with a kidney stone, follow your health care provider's instructions regarding straining your urine to catch the stone.  Empty your bladder often. Avoid holding urine for long periods of time.  After a bowel movement, women should cleanse front to back. Use each tissue only once.  Empty your bladder before and after sexual intercourse if you are a male. SEEK MEDICAL CARE IF:  You develop back pain.  You have a fever.  You have a feeling of sickness in your stomach (nausea) or vomiting.  Your symptoms are not better in 3 days. Return sooner if you are getting worse. SEEK IMMEDIATE MEDICAL CARE IF:   You develop severe vomiting and are unable to keep the medicine down.  You develop severe back or abdominal pain despite taking your medicines.  You begin passing a large amount of blood or clots in your urine.  You feel extremely weak or faint, or you pass out. MAKE SURE YOU:   Understand these instructions.  Will watch your condition.  Will get help right away if you are not doing well or get worse.   This information is not intended to replace advice given to you by your health care provider. Make sure you discuss any questions you have with your health care provider.   Document Released: 09/29/2005 Document Revised: 10/20/2014 Document Reviewed: 05/30/2013 Elsevier Interactive Patient Education [employer].

## 2015-11-02 NOTE — Progress Notes (Signed)
10:53 AM   DAMAN STEFFENHAGEN December 02, 1934 [id number]  Referring provider: Kirk Ruths, MD [facility] [place], [place] [address]  Chief Complaint  Patient presents with  . Benign Prostatic Hypertrophy  . OTHER    CIC problems    HPI: Patient is a 81 year old male with a hisotry of HTN, CAD, TIA, BPH, HLD presenting today for follow-up after being seen in the emergency department on 10/19/15 for an acute episode of urinary retention. He reports one prior episode of urinary retention post lung surgery years ago. He reports no symptoms of urinary frequency, urgency, hesitancy, gross hematuria or sensation of incomplete bladder emptying at baseline. He does report nocturia twice per night on average  He reports that he has been told he had an enlarged prostate by his primary care provider in the past.  He has taken saw palmetto but no prescription medications for BPH.     S/p per patient CVA October 2017.  Former smoker 1 ppd x 40 years.  Current Status: Patient returned after his last visit for a voiding trial which was ultimately unsuccessful. He was instructed to perform self-catheterization's 4 times daily for inability to void. He reports that he has only been performing catheterizations twice daily and has experienced intermittent suprapubic pain. He reports only dribbling throughout the day. Most recently he has noticed gross hematuria when he performs self-catheterization over the last 2 days. He reports urine is koolaid colored. He has not noticed any blood clots. He feels that he is able to empty his bladder well when he catheterizes. He denies any fevers or flank pain.   Currently therapy includes finasteride and tamsulosin.  Anticoagulation therapy includes aspirin and plavix  PMH: Past Medical History  Diagnosis Date  . Hypertension   . Cancer ([place])     skin  . History of lung cancer 2001    Rt upper lobe; s/p chemo and radiation with  recurrence  . Enlarged prostate   . Nocturia associated with benign prostatic hypertrophy   . Arthritis   . Hearing aid worn     both ears  . CAD (coronary artery disease)   . HLD (hyperlipidemia)     Surgical History: Past Surgical History  Procedure Laterality Date  . Pilonidal cyst / sinus excision  1958  . Rotator cuff repair  [id number]  . Lung cancer surgery Right 2001  . Epidural block injection    . Lumbar laminectomy/decompression microdiscectomy Right 01/17/2013    Procedure: Right Lumbar Two-three,Lumbar three-four,Lumbar Four-Five Decompressive Lumbar laminectomy;  Surgeon: Charlie Pitter, MD;  Location: [facility];  Service: Neurosurgery;  Laterality: Right;  right    Home Medications:    Medication List       This list is accurate as of: 11/02/15 10:53 AM.  Always use your most recent med list.               aspirin 81 MG tablet  Take 81 mg by mouth daily.     atorvastatin 40 MG tablet  Commonly known as:  LIPITOR  Take 1 tablet (40 mg total) by mouth daily at 6 PM.     clopidogrel 75 MG tablet  Commonly known as:  PLAVIX  Take 1 tablet (75 mg total) by mouth daily.     finasteride 5 MG tablet  Commonly known as:  PROSCAR     lisinopril-hydrochlorothiazide 20-25 MG tablet  Commonly known as:  PRINZIDE,ZESTORETIC  lovastatin 10 MG tablet  Commonly known as:  MEVACOR     tamsulosin 0.4 MG Caps capsule  Commonly known as:  FLOMAX  Take 0.4 mg by mouth.     traZODone 50 MG tablet  Commonly known as:  DESYREL        Allergies:  Allergies  Allergen Reactions  . Statins     Other reaction(s): Muscle Pain    Family History: Family History  Problem Relation Age of Onset  . Alzheimer's disease Mother   . Alcohol abuse Father   . Heart disease Father   . Prostate cancer Brother     Social History:  reports that he has quit smoking. His smoking use included Cigarettes. He has a 35 pack-year smoking history. He does not have any smokeless  tobacco history on file. He reports that he does not drink alcohol or use illicit drugs.  ROS: UROLOGY Frequent Urination?: No Hard to postpone urination?: No Burning/pain with urination?: No Get up at night to urinate?: Yes Leakage of urine?: No Urine stream starts and stops?: No Trouble starting stream?: No Do you have to strain to urinate?: No Blood in urine?: Yes Urinary tract infection?: Yes Sexually transmitted disease?: No Injury to kidneys or bladder?: No Painful intercourse?: No Weak stream?: No Erection problems?: No Penile pain?: No  Gastrointestinal Nausea?: No Vomiting?: No Indigestion/heartburn?: No Diarrhea?: Yes Constipation?: No  Constitutional Fever: No Night sweats?: No Weight loss?: No Fatigue?: Yes  Skin Skin rash/lesions?: No Itching?: No  Eyes Blurred vision?: No Double vision?: No  Ears/Nose/Throat Sore throat?: No Sinus problems?: No  Hematologic/Lymphatic Swollen glands?: No Easy bruising?: No  Cardiovascular Leg swelling?: No Chest pain?: No  Respiratory Cough?: No Shortness of breath?: No  Endocrine Excessive thirst?: No  Musculoskeletal Back pain?: No Joint pain?: No  Neurological Headaches?: No Dizziness?: No  Psychologic Depression?: No Anxiety?: No  Physical Exam: BP 123/95 mmHg  Pulse 125  Resp 16  Ht '5\' 5"'$  (1.651 m)  Wt 201 lb 1.6 oz (91.218 kg)  BMI 33.46 kg/m2  Constitutional:  Alert and oriented, No acute distress. HEENT:  AT, moist mucus membranes.  Trachea midline, no masses. Cardiovascular: No clubbing, cyanosis, or edema. Respiratory: Normal respiratory effort, no increased work of breathing. GI: Abdomen is soft, nontender, nondistended, no abdominal masses Skin: No rashes, bruises or suspicious lesions. Neurologic: Grossly intact, no focal deficits, moving all 4 extremities. Psychiatric: Normal mood and affect.  Laboratory Data: Results for orders placed or performed during the  hospital encounter of 16/10/96  Basic metabolic panel  Result Value Ref Range   Sodium 138 135 - 145 mmol/L   Potassium 4.0 3.5 - 5.1 mmol/L   Chloride 101 101 - 111 mmol/L   CO2 29 22 - 32 mmol/L   Glucose, Bld 105 (H) 65 - 99 mg/dL   BUN 14 6 - 20 mg/dL   Creatinine, Ser 1.13 0.61 - 1.24 mg/dL   Calcium 9.2 8.9 - 10.3 mg/dL   GFR calc non Af Amer >60 >60 mL/min   GFR calc Af Amer >60 >60 mL/min   Anion gap 8 5 - 15  CBC  Result Value Ref Range   WBC 12.3 (H) 3.8 - 10.6 K/uL   RBC 5.15 4.40 - 5.90 MIL/uL   Hemoglobin 14.3 13.0 - 18.0 g/dL   HCT 43.1 40.0 - 52.0 %   MCV 83.6 80.0 - 100.0 fL   MCH 27.8 26.0 - 34.0 pg   MCHC 33.3 32.0 -  36.0 g/dL   RDW 15.2 (H) 11.5 - 14.5 %   Platelets 209 150 - 440 K/uL  Urinalysis complete, with microscopic- may I&O cath if menses [facility] only)  Result Value Ref Range   Color, Urine YELLOW (A) YELLOW   APPearance CLEAR (A) CLEAR   Glucose, UA NEGATIVE NEGATIVE mg/dL   Bilirubin Urine NEGATIVE NEGATIVE   Ketones, ur NEGATIVE NEGATIVE mg/dL   Specific Gravity, Urine 1.006 1.005 - 1.030   Hgb urine dipstick 3+ (A) NEGATIVE   pH 6.0 5.0 - 8.0   Protein, ur NEGATIVE NEGATIVE mg/dL   Nitrite NEGATIVE NEGATIVE   Leukocytes, UA NEGATIVE NEGATIVE   RBC / HPF TOO NUMEROUS TO COUNT 0 - 5 RBC/hpf   WBC, UA NONE SEEN 0 - 5 WBC/hpf   Bacteria, UA NONE SEEN NONE SEEN   Squamous Epithelial / LPF NONE SEEN NONE SEEN   Mucous PRESENT     Pertinent Imaging:   Assessment & Plan:    1. Urinary retention-   [place] suspicious for infection. Specimen sent for culture. Patient divided option of reinserting the catheter today which he declined. He was instructed to increase CIC to 3-4 times daily. He was provided a urinal and bladder diary to keep track of his output as well as additional catheter samples.    2. Gross hematuria-  In the setting of likely urinary tract infection and urinary retention.  Former smoker.  We discussed the differential diagnosis for  microscopic hematuria including nephrolithiasis, renal or upper tract tumors, bladder stones, UTIs, or bladder tumors as well as undetermined etiologies. Per AUA guidelines, I did recommend complete microscopic hematuria evaluation including CTU, possible urine cytology, and office cystoscopy. -CT Urogram -cystoscopy  3. BPH with LUTS- Prostate significant currently enlarged on exam. We'll continue Flomax and finasteride.  After patient left the office it was noted that he has a history of elevated PSA.  I am unable to find lab documentation of this in available notes.   Patient's heart rate was elevated at 125 today. Per CMA Richardson Landry patient had just walked back from the waiting area. I requested Richardson Landry recheck HR prior to patient leaving but this was not done. We will recheck his heart rate at his next visit. Patient was not complaining of chest pain or shortness of breath.  There are no diagnoses linked to this encounter.  Return for CT Urogram results and cystoscopy.  These notes generated with voice recognition software. I apologize for typographical errors.  Herbert Moors, [facility] [address], [place], [place] [address] [phone]

## 2015-11-04 LAB — CULTURE, URINE COMPREHENSIVE

## 2015-11-05 ENCOUNTER — Telehealth: Payer: Self-pay | Admitting: Obstetrics and Gynecology

## 2015-11-05 MED ORDER — AMOXICILLIN-POT CLAVULANATE 500-125 MG PO TABS: 1.0000 | ORAL_TABLET | Freq: Two times a day (BID) | ORAL | Status: DC

## 2015-11-05 NOTE — Telephone Encounter (Signed)
Please notify patient that his urine culture was positive for infection. I have sent in a prescription for Augmentin to his pharmacy that he needs to pick up  and complete.

## 2015-11-05 NOTE — Telephone Encounter (Signed)
Patient's wife notified of message/SW

## 2015-11-05 NOTE — Telephone Encounter (Signed)
Per Ria Comment, please notify pt to disregard the times to take medication on the order.

## 2015-11-09 ENCOUNTER — Ambulatory Visit: Payer: Medicare Other

## 2015-11-09 DIAGNOSIS — R339 Retention of urine, unspecified: Secondary | ICD-10-CM

## 2015-11-09 NOTE — Progress Notes (Signed)
Pt came in this morning for a nurse appt. Pt and nurse believe this was an old appt. Pt is currently taking abx as prescribed and has several days left. Pt wife stated pt will have CT next week and is scheduled for a cysto. Pt requested for more catheters. Samples were provided.

## 2015-11-14 ENCOUNTER — Ambulatory Visit
Admission: RE | Admit: 2015-11-14 | Discharge: 2015-11-14 | Disposition: A | Discharge status: Home / Self Care | Payer: Medicare Other | Source: Ambulatory Visit | Attending: Obstetrics and Gynecology | Admitting: Obstetrics and Gynecology

## 2015-11-14 DIAGNOSIS — R310 Gross hematuria: Secondary | ICD-10-CM | POA: Diagnosis present

## 2015-11-14 DIAGNOSIS — R911 Solitary pulmonary nodule: Secondary | ICD-10-CM | POA: Diagnosis not present

## 2015-11-14 DIAGNOSIS — N400 Enlarged prostate without lower urinary tract symptoms: Secondary | ICD-10-CM | POA: Diagnosis present

## 2015-11-14 DIAGNOSIS — N289 Disorder of kidney and ureter, unspecified: Secondary | ICD-10-CM | POA: Diagnosis not present

## 2015-11-14 MED ORDER — IOHEXOL 300 MG/ML  SOLN
125.0000 mL | Freq: Once | INTRAMUSCULAR | Status: AC | PRN
  Administered 2015-11-14: 125 mL via INTRAVENOUS

## 2015-11-20 ENCOUNTER — Encounter: Payer: Self-pay | Admitting: Urology

## 2015-11-20 ENCOUNTER — Ambulatory Visit (INDEPENDENT_AMBULATORY_CARE_PROVIDER_SITE_OTHER): Payer: Medicare Other | Admitting: Urology

## 2015-11-20 VITALS — BP 101/65 | HR 111 | Ht 67.0 in | Wt 200.4 lb

## 2015-11-20 DIAGNOSIS — R310 Gross hematuria: Secondary | ICD-10-CM

## 2015-11-20 LAB — MICROSCOPIC EXAMINATION: Epithelial Cells (non renal): NONE SEEN /hpf (ref 0–10)

## 2015-11-20 LAB — URINALYSIS, COMPLETE
BILIRUBIN UA: NEGATIVE
GLUCOSE, UA: NEGATIVE
KETONES UA: NEGATIVE
Nitrite, UA: POSITIVE — AB
Specific Gravity, UA: 1.02 (ref 1.005–1.030)
UUROB: 0.2 mg/dL (ref 0.2–1.0)
pH, UA: 5.5 (ref 5.0–7.5)

## 2015-11-20 LAB — BLADDER SCAN AMB NON-IMAGING: SCAN RESULT: 116

## 2015-11-20 MED ORDER — SULFAMETHOXAZOLE-TRIMETHOPRIM 800-160 MG PO TABS: 1.0000 | ORAL_TABLET | Freq: Two times a day (BID) | ORAL | Status: DC

## 2015-11-20 MED ORDER — CIPROFLOXACIN HCL 500 MG PO TABS
500.0000 mg | ORAL_TABLET | Freq: Once | ORAL | Status: AC
  Administered 2015-11-20: 500 mg via ORAL

## 2015-11-20 MED ORDER — LIDOCAINE HCL 2 % EX GEL
1.0000 "application " | Freq: Once | CUTANEOUS | Status: AC
  Administered 2015-11-20: 1 via URETHRAL

## 2015-11-20 NOTE — Progress Notes (Signed)
11/20/2015 10:07 AM   Roberto Adams [birth year] [id number]  Referring provider: Kirk Ruths, MD [facility] [place], [place] [address]  Chief Complaint  Patient presents with  . Cysto    gross hematuria    HPI: Roberto Adams is a 81yo with a hx of gross hematuria here for cystoscopy. His UA today is concerning for infections. He is not having symptoms   PMH: Past Medical History  Diagnosis Date  . Hypertension   . History of lung cancer 2001    Rt upper lobe; s/p chemo and radiation with recurrence  . Enlarged prostate   . Nocturia associated with benign prostatic hypertrophy   . Arthritis   . Hearing aid worn     both ears  . CAD (coronary artery disease)   . HLD (hyperlipidemia)   . Cancer ([place])     skin  . Lung cancer ([place])   . TIA (transient ischemic attack) 08/08/2015  . Vasomotor rhinitis 08/08/2014  . Mild cognitive disorder 07/22/2014    Last Assessment & Plan:  Memory seems to be stable   . Benign prostatic hyperplasia with urinary obstruction 11/06/2012  . Amnesia, global, transient 08/20/2015    Overview:  Better at [facility], mri negative. On lipitor and plavix. Only issue is insomnia now.    . Elevated prostate specific antigen (PSA) 07/05/2012  . Lumbar canal stenosis 01/17/2013  . Pure hypercholesterolemia 07/22/2014    Last Assessment & Plan:  Has been working on a low fat diet and no myalgia's are present.    Marland Kitchen Spinal stenosis, lumbar region, with neurogenic claudication 01/17/2013    Surgical History: Past Surgical History  Procedure Laterality Date  . Pilonidal cyst / sinus excision  1958  . Rotator cuff repair  [id number]  . Lung cancer surgery Right 2001  . Epidural block injection    . Lumbar laminectomy/decompression microdiscectomy Right 01/17/2013    Procedure: Right Lumbar Two-three,Lumbar three-four,Lumbar Four-Five Decompressive Lumbar laminectomy;  Surgeon: Roberto Pitter, MD;  Location: [facility];  Service:  Neurosurgery;  Laterality: Right;  right    Home Medications:    Medication List       This list is accurate as of: 11/20/15 10:07 AM.  Always use your most recent med list.               amoxicillin-clavulanate 500-125 MG tablet  Commonly known as:  AUGMENTIN  Take 1 tablet (500 mg total) by mouth 2 times daily at 12 noon and 4 pm.     aspirin 81 MG tablet  Take 81 mg by mouth daily.     atorvastatin 40 MG tablet  Commonly known as:  LIPITOR  Take 1 tablet (40 mg total) by mouth daily at 6 PM.     clopidogrel 75 MG tablet  Commonly known as:  PLAVIX  Take 1 tablet (75 mg total) by mouth daily.     finasteride 5 MG tablet  Commonly known as:  PROSCAR     lisinopril-hydrochlorothiazide 20-25 MG tablet  Commonly known as:  PRINZIDE,ZESTORETIC     lovastatin 10 MG tablet  Commonly known as:  MEVACOR  Reported on 11/20/2015     tamsulosin 0.4 MG Caps capsule  Commonly known as:  FLOMAX  Take 0.4 mg by mouth.     traZODone 50 MG tablet  Commonly known as:  DESYREL        Allergies:  Allergies  Allergen Reactions  .  Statins     Other reaction(s): Muscle Pain    Family History: Family History  Problem Relation Age of Onset  . Alzheimer's disease Mother   . Alcohol abuse Father   . Heart disease Father   . Prostate cancer Brother     Social History:  reports that he has quit smoking. His smoking use included Cigarettes. He has a 35 pack-year smoking history. He does not have any smokeless tobacco history on file. He reports that he does not drink alcohol or use illicit drugs.  ROS:                                        Physical Exam: BP 101/65 mmHg  Pulse 111  Ht '5\' 7"'$  (1.702 m)  Wt 90.901 kg (200 lb 6.4 oz)  BMI 31.38 kg/m2  Constitutional:  Alert and oriented, No acute distress. HEENT: [place] AT, moist mucus membranes.  Trachea midline, no masses. Cardiovascular: No clubbing, cyanosis, or edema. Respiratory: Normal respiratory  effort, no increased work of breathing. GI: Abdomen is soft, nontender, nondistended, no abdominal masses GU: No CVA tenderness.  Skin: No rashes, bruises or suspicious lesions. Lymph: No cervical or inguinal adenopathy. Neurologic: Grossly intact, no focal deficits, moving all 4 extremities. Psychiatric: Normal mood and affect.  Laboratory Data: Lab Results  Component Value Date   WBC 12.3* 10/19/2015   HGB 14.3 10/19/2015   HCT 43.1 10/19/2015   MCV 83.6 10/19/2015   PLT 209 10/19/2015    Lab Results  Component Value Date   CREATININE 1.13 10/19/2015    No results found for: PSA  No results found for: TESTOSTERONE  No results found for: HGBA1C  Urinalysis    Component Value Date/Time   COLORURINE YELLOW* 10/19/2015 1358   COLORURINE YELLOW 12/16/2013 1409   APPEARANCEUR CLEAR* 10/19/2015 [place] 12/16/2013 1409   LABSPEC 1.006 10/19/2015 1358   LABSPEC 1.025 12/16/2013 1409   PHURINE 6.0 10/19/2015 1358   PHURINE 6.0 12/16/2013 1409   GLUCOSEU Negative 11/02/2015 0948   GLUCOSEU NEGATIVE 12/16/2013 1409   HGBUR 3+* 10/19/2015 1358   HGBUR NEGATIVE 12/16/2013 1409   BILIRUBINUR Negative 11/02/2015 [place] 10/19/2015 1358   BILIRUBINUR NEGATIVE 12/16/2013 1409   KETONESUR NEGATIVE 10/19/2015 1358   KETONESUR NEGATIVE 12/16/2013 1409   PROTEINUR NEGATIVE 10/19/2015 1358   PROTEINUR NEGATIVE 12/16/2013 1409   NITRITE Positive* 11/02/2015 0948   NITRITE NEGATIVE 10/19/2015 1358   NITRITE NEGATIVE 12/16/2013 1409   LEUKOCYTESUR 1+* 11/02/2015 0948   LEUKOCYTESUR NEGATIVE 10/19/2015 1358   LEUKOCYTESUR 1+ 12/16/2013 1409    Pertinent Imaging: CT scan: right upper pole mass, left lower lobe nodule 2m  Assessment & Plan:    1. Gross hematuria -urine for culture, will reschedule cysto - Urinalysis, Complete - bactrim DS BID for 7 days - lidocaine (XYLOCAINE) 2 % jelly 1 application; Place 1 application into the urethra  once. - Bladder Scan (Post Void Residual) in office   No Follow-up on file.  Roberto [facility] [address] [place] [phone]

## 2015-11-20 NOTE — Progress Notes (Signed)
Bladder Scan Patient void: 116 ml Performed By: Larna Daughters

## 2015-11-22 LAB — CULTURE, URINE COMPREHENSIVE

## 2015-11-23 ENCOUNTER — Telehealth: Payer: Self-pay

## 2015-11-23 DIAGNOSIS — N390 Urinary tract infection, site not specified: Secondary | ICD-10-CM

## 2015-11-23 MED ORDER — SULFAMETHOXAZOLE-TRIMETHOPRIM 800-160 MG PO TABS: 1.0000 | ORAL_TABLET | Freq: Two times a day (BID) | ORAL | Status: AC

## 2015-11-23 NOTE — Telephone Encounter (Signed)
Spoke with pt wife and made aware of +ucx. Wife voiced understanding. Medication sent to pt pharmacy. Per Ria Comment bactrim ds bid x7 was given.

## 2015-11-30 ENCOUNTER — Ambulatory Visit: Payer: Medicare Other

## 2015-11-30 DIAGNOSIS — N390 Urinary tract infection, site not specified: Secondary | ICD-10-CM

## 2015-11-30 LAB — MICROSCOPIC EXAMINATION
Bacteria, UA: NONE SEEN
EPITHELIAL CELLS (NON RENAL): NONE SEEN /HPF (ref 0–10)

## 2015-11-30 LAB — URINALYSIS, COMPLETE
BILIRUBIN UA: NEGATIVE
GLUCOSE, UA: NEGATIVE
KETONES UA: NEGATIVE
LEUKOCYTES UA: NEGATIVE
NITRITE UA: NEGATIVE
Protein, UA: NEGATIVE
SPEC GRAV UA: 1.025 (ref 1.005–1.030)
Urobilinogen, Ur: 0.2 mg/dL (ref 0.2–1.0)
pH, UA: 5.5 (ref 5.0–7.5)

## 2015-12-04 ENCOUNTER — Ambulatory Visit (INDEPENDENT_AMBULATORY_CARE_PROVIDER_SITE_OTHER): Payer: Medicare Other | Admitting: Urology

## 2015-12-04 VITALS — BP 121/82 | HR 101 | Ht 65.0 in | Wt 205.4 lb

## 2015-12-04 DIAGNOSIS — R310 Gross hematuria: Secondary | ICD-10-CM | POA: Diagnosis not present

## 2015-12-04 MED ORDER — CIPROFLOXACIN HCL 500 MG PO TABS
500.0000 mg | ORAL_TABLET | Freq: Once | ORAL | Status: AC
  Administered 2015-12-04: 500 mg via ORAL

## 2015-12-04 MED ORDER — LIDOCAINE HCL 2 % EX GEL
1.0000 "application " | Freq: Once | CUTANEOUS | Status: AC
  Administered 2015-12-04: 1 via URETHRAL

## 2015-12-04 NOTE — Progress Notes (Signed)
    Cystoscopy Procedure Note  Patient identification was confirmed, informed consent was obtained, and patient was prepped using Betadine solution.  Lidocaine jelly was administered per urethral meatus.    Preoperative abx where received prior to procedure.     Pre-Procedure: - Inspection reveals a normal caliber ureteral meatus.  Procedure: The flexible cystoscope was introduced without difficulty - No urethral strictures/lesions are present. - Enlarged prostate  - Elevated bladder neck - Bilateral ureteral orifices identified - Bladder mucosa  reveals no ulcers, tumors, or lesions - No bladder stones - No trabeculation  Retroflexion shows 1cm intravesical prostatic protrusion   Post-Procedure: - Patient tolerated the procedure well  Plan:  1. Continue finasteride and flomax 2. RTC 3 months

## 2015-12-07 DIAGNOSIS — R310 Gross hematuria: Secondary | ICD-10-CM | POA: Insufficient documentation

## 2016-03-03 ENCOUNTER — Ambulatory Visit (INDEPENDENT_AMBULATORY_CARE_PROVIDER_SITE_OTHER): Payer: Medicare Other | Admitting: Urology

## 2016-03-03 ENCOUNTER — Encounter: Payer: Self-pay | Admitting: Urology

## 2016-03-03 VITALS — BP 111/68 | HR 118 | Ht 65.5 in | Wt 206.8 lb

## 2016-03-03 DIAGNOSIS — R911 Solitary pulmonary nodule: Secondary | ICD-10-CM | POA: Diagnosis not present

## 2016-03-03 DIAGNOSIS — N289 Disorder of kidney and ureter, unspecified: Secondary | ICD-10-CM | POA: Diagnosis not present

## 2016-03-03 DIAGNOSIS — N400 Enlarged prostate without lower urinary tract symptoms: Secondary | ICD-10-CM

## 2016-03-03 LAB — URINALYSIS, COMPLETE
BILIRUBIN UA: NEGATIVE
GLUCOSE, UA: NEGATIVE
KETONES UA: NEGATIVE
Nitrite, UA: POSITIVE — AB
PROTEIN UA: NEGATIVE
SPEC GRAV UA: 1.025 (ref 1.005–1.030)
Urobilinogen, Ur: 0.2 mg/dL (ref 0.2–1.0)
pH, UA: 5.5 (ref 5.0–7.5)

## 2016-03-03 LAB — MICROSCOPIC EXAMINATION
EPITHELIAL CELLS (NON RENAL): NONE SEEN /HPF (ref 0–10)
RBC MICROSCOPIC, UA: NONE SEEN /HPF (ref 0–?)

## 2016-03-03 NOTE — Progress Notes (Signed)
12:50 PM   Roberto Adams Mar 16, 1935 [id number]  Referring provider: Kirk Ruths, MD [facility] [place], [name] [address]  Chief Complaint  Patient presents with  . Benign Prostatic Hypertrophy    3 month follow up  . Hematuria    gross    HPI: Patient is a 81 year old Caucasian male who presents today for 3 month follow-up for BPH with LUTS and gross hematuria.  Background history Patient with a history of HTN, CAD, TIA, BPH, HLD presenting today for follow-up after being seen in the emergency department on 10/19/15 for an acute episode of urinary retention. He reports one prior episode of urinary retention post lung surgery years ago. He reports no symptoms of urinary frequency, urgency, hesitancy, gross hematuria or sensation of incomplete bladder emptying at baseline. He does report nocturia twice per night on average.  S/p per patient CVA October 2017.  Former smoker 1 ppd x 40 years.  Patient returned after his last visit for a voiding trial which was ultimately unsuccessful. He was instructed to perform self-catheterization.    He did have an episode of gross hematuria.  He did complete a hematuria work up with CT Urogram and cystoscopy earlier this year.  A 15 mm enhancing lesion was found in the upper pole of the right kidney.  It was difficult to characterize due to its small size.    Currently therapy includes finasteride and tamsulosin.  Anticoagulation therapy includes aspirin and Plavix  Today, patient reports he has no difficulty with urination. He has not had any episodes of gross hematuria. He is no longer having to catheterize himself.  UA is negative for hematuria on today's exam.        IPSS      03/03/16 0900       International Prostate Symptom Score   How often have you had the sensation of not emptying your bladder? Not at All     How often have you had to urinate less than every two hours? Not at All     How often  have you found you stopped and started again several times when you urinated? Not at All     How often have you found it difficult to postpone urination? Not at All     How often have you had a weak urinary stream? Not at All     How often have you had to strain to start urination? Not at All     How many times did you typically get up at night to urinate? 2 Times     Total IPSS Score 2     Quality of Life due to urinary symptoms   If you were to spend the rest of your life with your urinary condition just the way it is now how would you feel about that? Pleased        Score:  1-7 Mild 8-19 Moderate 20-35 Severe  PMH: Past Medical History  Diagnosis Date  . Hypertension   . History of lung cancer 2001    Rt upper lobe; s/p chemo and radiation with recurrence  . Enlarged prostate   . Nocturia associated with benign prostatic hypertrophy   . Arthritis   . Hearing aid worn     both ears  . CAD (coronary artery disease)   . HLD (hyperlipidemia)   . Cancer ([place])     skin  . Lung cancer ([place])   . TIA (transient  ischemic attack) 08/08/2015  . Vasomotor rhinitis 08/08/2014  . Mild cognitive disorder 07/22/2014    Last Assessment & Plan:  Memory seems to be stable   . Benign prostatic hyperplasia with urinary obstruction 11/06/2012  . Amnesia, global, transient 08/20/2015    Overview:  Better at [facility], mri negative. On lipitor and plavix. Only issue is insomnia now.    . Elevated prostate specific antigen (PSA) 07/05/2012  . Lumbar canal stenosis 01/17/2013  . Pure hypercholesterolemia 07/22/2014    Last Assessment & Plan:  Has been working on a low fat diet and no myalgia's are present.    Marland Kitchen Spinal stenosis, lumbar region, with neurogenic claudication 01/17/2013    Surgical History: Past Surgical History  Procedure Laterality Date  . Pilonidal cyst / sinus excision  1958  . Rotator cuff repair  [id number]  . Lung cancer surgery Right 2001  . Epidural block injection    . Lumbar  laminectomy/decompression microdiscectomy Right 01/17/2013    Procedure: Right Lumbar Two-three,Lumbar three-four,Lumbar Four-Five Decompressive Lumbar laminectomy;  Surgeon: Charlie Pitter, MD;  Location: [facility];  Service: Neurosurgery;  Laterality: Right;  right    Home Medications:    Medication List       This list is accurate as of: 03/03/16 11:59 PM.  Always use your most recent med list.               aspirin 81 MG tablet  Take 81 mg by mouth daily.     atorvastatin 40 MG tablet  Commonly known as:  LIPITOR  Take 1 tablet (40 mg total) by mouth daily at 6 PM.     clopidogrel 75 MG tablet  Commonly known as:  PLAVIX  Take 1 tablet (75 mg total) by mouth daily.     finasteride 5 MG tablet  Commonly known as:  PROSCAR     lisinopril-hydrochlorothiazide 20-25 MG tablet  Commonly known as:  PRINZIDE,ZESTORETIC     lovastatin 10 MG tablet  Commonly known as:  MEVACOR  Reported on 11/20/2015     tamsulosin 0.4 MG Caps capsule  Commonly known as:  FLOMAX  Take 0.4 mg by mouth.     traZODone 50 MG tablet  Commonly known as:  DESYREL     vitamin B-12 1000 MCG tablet  Commonly known as:  CYANOCOBALAMIN  Take 1,000 mcg by mouth daily.        Allergies:  Allergies  Allergen Reactions  . Statins     Other reaction(s): Muscle Pain    Family History: Family History  Problem Relation Age of Onset  . Alzheimer's disease Mother   . Alcohol abuse Father   . Heart disease Father   . Prostate cancer Brother   . Kidney disease Neg Hx     Social History:  reports that he has quit smoking. His smoking use included Cigarettes. He has a 35 pack-year smoking history. He does not have any smokeless tobacco history on file. He reports that he does not drink alcohol or use illicit drugs.  ROS: UROLOGY Frequent Urination?: No Hard to postpone urination?: No Burning/pain with urination?: No Get up at night to urinate?: No Leakage of urine?: No Urine stream starts and  stops?: No Trouble starting stream?: No Do you have to strain to urinate?: No Blood in urine?: No Urinary tract infection?: No Sexually transmitted disease?: No Injury to kidneys or bladder?: No Painful intercourse?: No Weak stream?: No Erection problems?: No Penile pain?: No  Gastrointestinal  Nausea?: No Vomiting?: No Indigestion/heartburn?: No Diarrhea?: No Constipation?: No  Constitutional Fever: No Night sweats?: No Weight loss?: No Fatigue?: No  Skin Skin rash/lesions?: No Itching?: No  Eyes Blurred vision?: No Double vision?: No  Ears/Nose/Throat Sore throat?: No Sinus problems?: No  Hematologic/Lymphatic Swollen glands?: No Easy bruising?: No  Cardiovascular Leg swelling?: No Chest pain?: No  Respiratory Cough?: No Shortness of breath?: No  Endocrine Excessive thirst?: No  Musculoskeletal Back pain?: No Joint pain?: No  Neurological Headaches?: No Dizziness?: No  Psychologic Depression?: No Anxiety?: No  Physical Exam: BP 111/68 mmHg  Pulse 118  Ht 5' 5.5" (1.664 m)  Wt 206 lb 12.8 oz (93.804 kg)  BMI 33.88 kg/m2  Constitutional: Well nourished. Alert and oriented, No acute distress. HEENT: [place] AT, moist mucus membranes. Trachea midline, no masses. Cardiovascular: No clubbing, cyanosis, or edema. Respiratory: Normal respiratory effort, no increased work of breathing. GI: Abdomen is soft, non tender, non distended, no abdominal masses. Liver and spleen not palpable.  No hernias appreciated.  Stool sample for occult testing is not indicated.   GU: No CVA tenderness.  No bladder fullness or masses.  Patient with uncircumcised phallus.  Foreskin easily retracted   Urethral meatus is patent.  No penile discharge. No penile lesions or rashes. Scrotum without lesions, cysts, rashes and/or edema.  Testicles are located scrotally bilaterally. No masses are appreciated in the testicles. Left and right epididymis are normal. Rectal: Patient  with  normal sphincter tone. Anus and perineum without scarring or rashes. No rectal masses are appreciated. Prostate is approximately 60 grams, could not palpated the entire gland due to size, no nodules are appreciated. Seminal vesicles could not be palpated.   Skin: No rashes, bruises or suspicious lesions. Lymph: No cervical or inguinal adenopathy. Neurologic: Grossly intact, no focal deficits, moving all 4 extremities. Psychiatric: Normal mood and affect.  Laboratory Data: Results for orders placed or performed in visit on 03/03/16  Microscopic Examination  Result Value Ref Range   WBC, UA 11-30 (A) 0 -  5 /hpf   RBC, UA None seen 0 -  2 /hpf   Epithelial Cells (non renal) None seen 0 - 10 /hpf   Bacteria, UA Few None seen/Few  Urinalysis, Complete  Result Value Ref Range   Specific Gravity, UA 1.025 1.005 - 1.030   pH, UA 5.5 5.0 - 7.5   Color, UA Yellow Yellow   Appearance Ur Cloudy (A) Clear   Leukocytes, UA 1+ (A) Negative   Protein, UA Negative Negative/Trace   Glucose, UA Negative Negative   Ketones, UA Negative Negative   RBC, UA Trace (A) Negative   Bilirubin, UA Negative Negative   Urobilinogen, Ur 0.2 0.2 - 1.0 mg/dL   Nitrite, UA Positive (A) Negative   Microscopic Examination See below:      Assessment & Plan:    1. Urinary retention:  Resolved at this time.  He will RTC in one year for IPSS and PVR.    2. Gross hematuria:  Resolved.  Completed hematuria work up earlier this year.  See renal mass and pulmonary nodule.    3. BPH with LUTS:   Prostate significant currently enlarged on exam. We'll continue Flomax and finasteride.  He will RTC in one year for IPSS, exam and PVR.    4. Right renal lesion:   Patient with a 15 mm enhancing lesion in the upper pole of the right kidney.  Patient is elderly and with multiple co morbidities.  Renal masses that are 4 cm or less have less than 2% chance metastases.  We will obtain a CT of abdomen with and without contrast  for surveillance in 6 months.    5. Left lower lobe pulmonary nodule:   Patient was found to have a nodule in his left lower lobe of the lung. A follow-up chest CT is recommended in 6 months. Since we will be obtaining a CT scan of the abdomen with and without contrast we will include the chest portion at that time.  Return in about 6 months (around 09/03/2016) for CT scan report.  These notes generated with voice recognition software. I apologize for typographical errors.  Zara Council, [facility] [address] [place], [place] [address] [phone]

## 2016-05-27 ENCOUNTER — Other Ambulatory Visit: Payer: Self-pay

## 2016-05-27 DIAGNOSIS — N400 Enlarged prostate without lower urinary tract symptoms: Secondary | ICD-10-CM

## 2016-05-27 MED ORDER — FINASTERIDE 5 MG PO TABS: 5.0000 mg | ORAL_TABLET | Freq: Every day | ORAL | 6 refills | Status: DC

## 2016-05-30 ENCOUNTER — Emergency Department: Payer: Medicare Other

## 2016-05-30 ENCOUNTER — Emergency Department
Admission: EM | Admit: 2016-05-30 | Discharge: 2016-05-30 | Disposition: A | Discharge status: Home / Self Care | Payer: Medicare Other | Attending: Emergency Medicine | Admitting: Emergency Medicine

## 2016-05-30 ENCOUNTER — Encounter: Payer: Self-pay | Admitting: Emergency Medicine

## 2016-05-30 DIAGNOSIS — Z79899 Other long term (current) drug therapy: Secondary | ICD-10-CM | POA: Insufficient documentation

## 2016-05-30 DIAGNOSIS — G8929 Other chronic pain: Secondary | ICD-10-CM

## 2016-05-30 DIAGNOSIS — M549 Dorsalgia, unspecified: Secondary | ICD-10-CM | POA: Insufficient documentation

## 2016-05-30 DIAGNOSIS — Z87891 Personal history of nicotine dependence: Secondary | ICD-10-CM | POA: Insufficient documentation

## 2016-05-30 DIAGNOSIS — I2510 Atherosclerotic heart disease of native coronary artery without angina pectoris: Secondary | ICD-10-CM | POA: Diagnosis not present

## 2016-05-30 DIAGNOSIS — I10 Essential (primary) hypertension: Secondary | ICD-10-CM | POA: Diagnosis not present

## 2016-05-30 DIAGNOSIS — Z85828 Personal history of other malignant neoplasm of skin: Secondary | ICD-10-CM | POA: Diagnosis not present

## 2016-05-30 DIAGNOSIS — Z7982 Long term (current) use of aspirin: Secondary | ICD-10-CM | POA: Insufficient documentation

## 2016-05-30 DIAGNOSIS — R531 Weakness: Secondary | ICD-10-CM | POA: Diagnosis present

## 2016-05-30 DIAGNOSIS — Z85118 Personal history of other malignant neoplasm of bronchus and lung: Secondary | ICD-10-CM | POA: Diagnosis not present

## 2016-05-30 LAB — BASIC METABOLIC PANEL
Anion gap: 8 (ref 5–15)
BUN: 18 mg/dL (ref 6–20)
CHLORIDE: 102 mmol/L (ref 101–111)
CO2: 24 mmol/L (ref 22–32)
Calcium: 8.8 mg/dL — ABNORMAL LOW (ref 8.9–10.3)
Creatinine, Ser: 1.07 mg/dL (ref 0.61–1.24)
GFR calc Af Amer: >60 mL/min (ref >60)
GFR calc non Af Amer: >60 mL/min (ref >60)
GLUCOSE: 105 mg/dL — AB (ref 65–99)
POTASSIUM: 3.5 mmol/L (ref 3.5–5.1)
SODIUM: 134 mmol/L — AB (ref 135–145)

## 2016-05-30 LAB — URINALYSIS COMPLETE WITH MICROSCOPIC (ARMC ONLY)
BACTERIA UA: NONE SEEN
BILIRUBIN URINE: NEGATIVE
Glucose, UA: NEGATIVE mg/dL
Ketones, ur: NEGATIVE mg/dL
LEUKOCYTES UA: NEGATIVE
Nitrite: NEGATIVE
PH: 5 (ref 5.0–8.0)
PROTEIN: NEGATIVE mg/dL
Specific Gravity, Urine: 1.015 (ref 1.005–1.030)

## 2016-05-30 LAB — CBC
HEMATOCRIT: 39.5 % — AB (ref 40.0–52.0)
Hemoglobin: 13.6 g/dL (ref 13.0–18.0)
MCH: 28.9 pg (ref 26.0–34.0)
MCHC: 34.4 g/dL (ref 32.0–36.0)
MCV: 83.9 fL (ref 80.0–100.0)
Platelets: 195 10*3/uL (ref 150–440)
RBC: 4.71 MIL/uL (ref 4.40–5.90)
RDW: 15.8 % — AB (ref 11.5–14.5)
WBC: 11 10*3/uL — AB (ref 3.8–10.6)

## 2016-05-30 NOTE — ED Triage Notes (Signed)
Ems pt from home, with bilateral leg weakness, uses a walker, hx of stoke, incontinent of urine, pt arrives, equal smile , equal hand grips, speaking with a slight speech impediment,

## 2016-05-30 NOTE — ED Provider Notes (Signed)
[facility] Emergency Department Provider Note  Time seen: 3:32 PM  I have reviewed the triage vital signs and the nursing notes.   HISTORY  Chief Complaint Weakness    HPI Roberto Adams is a 81 y.o. male with a past medical history of BPH, CAD, hypertension, hyperlipidemia, amnesia,TIA, who presents to the emergency department for lower extremity weakness. According to the patient he went to pick up his hearing aids today, states he's been feeling somewhat weak all day however upon trying to get out of the car he felt very weak and could not get himself up so he called EMS to bring him to the emergency department. Patient states he has felt similar symptoms in the past. States most of the weakness has resolved. He does state mild cough and congestion over the past 3 days. Denies any known fever. Denies abdominal pain or chest pain, nausea, vomiting or diarrhea. States he's had some issues with his urination as of lately, as far as incontinence episodes. Denies any dysuria or hematuria. Denies any focal deficits.  Past Medical History:  Diagnosis Date  . Amnesia, global, transient 08/20/2015   Overview:  Better at [facility], mri negative. On lipitor and plavix. Only issue is insomnia now.    . Arthritis   . Benign prostatic hyperplasia with urinary obstruction 11/06/2012  . CAD (coronary artery disease)   . Cancer ([place])    skin  . Elevated prostate specific antigen (PSA) 07/05/2012  . Enlarged prostate   . Hearing aid worn    both ears  . History of lung cancer 2001   Rt upper lobe; s/p chemo and radiation with recurrence  . HLD (hyperlipidemia)   . Hypertension   . Lumbar canal stenosis 01/17/2013  . Lung cancer ([place])   . Mild cognitive disorder 07/22/2014   Last Assessment & Plan:  Memory seems to be stable   . Nocturia associated with benign prostatic hypertrophy   . Pure hypercholesterolemia 07/22/2014   Last Assessment & Plan:  Has been working on a low fat  diet and no myalgia's are present.    Marland Kitchen Spinal stenosis, lumbar region, with neurogenic claudication 01/17/2013  . TIA (transient ischemic attack) 08/08/2015  . Vasomotor rhinitis 08/08/2014    Patient Active Problem List   Diagnosis Date Noted  . Gross hematuria 12/07/2015  . Amnesia, global, transient 08/20/2015  . TIA (transient ischemic attack) 08/08/2015  . HTN (hypertension) 08/08/2015  . CAD (coronary artery disease) 08/08/2015  . HLD (hyperlipidemia) 08/08/2015  . Dietary vitamin B12 deficiency anemia 08/08/2014  . Vasomotor rhinitis 08/08/2014  . Mild cognitive disorder 07/22/2014  . Essential (primary) hypertension 07/22/2014  . H/O malignant neoplasm 07/22/2014  . Pure hypercholesterolemia 07/22/2014  . Spinal stenosis, lumbar region, with neurogenic claudication 01/17/2013  . Lumbar canal stenosis 01/17/2013  . Benign prostatic hyperplasia with urinary obstruction 11/06/2012  . Elevated prostate specific antigen (PSA) 07/05/2012    Past Surgical History:  Procedure Laterality Date  . EPIDURAL BLOCK INJECTION    . LUMBAR LAMINECTOMY/DECOMPRESSION MICRODISCECTOMY Right 01/17/2013   Procedure: Right Lumbar Two-three,Lumbar three-four,Lumbar Four-Five Decompressive Lumbar laminectomy;  Surgeon: Charlie Pitter, MD;  Location: [facility];  Service: Neurosurgery;  Laterality: Right;  right  . LUNG CANCER SURGERY Right 2001  . PILONIDAL CYST / SINUS EXCISION  1958  . ROTATOR CUFF REPAIR  [phone]    Prior to Admission medications   Medication Sig Start Date End Date Taking? Authorizing Provider  aspirin 81 MG  tablet Take 81 mg by mouth daily.    Historical Provider, MD  atorvastatin (LIPITOR) 40 MG tablet Take 1 tablet (40 mg total) by mouth daily at 6 PM. Patient not taking: Reported on 03/03/2016 08/09/15   Gladstone Lighter, MD  clopidogrel (PLAVIX) 75 MG tablet Take 1 tablet (75 mg total) by mouth daily. 08/09/15   Gladstone Lighter, MD  finasteride (PROSCAR) 5 MG tablet  Take 1 tablet (5 mg total) by mouth daily. 05/27/16   Nori Riis, PA-C  lisinopril-hydrochlorothiazide (PRINZIDE,ZESTORETIC) 20-25 MG tablet  09/03/15   Historical Provider, MD  lovastatin (MEVACOR) 10 MG tablet Reported on 11/20/2015 10/30/15   Historical Provider, MD  tamsulosin (FLOMAX) 0.4 MG CAPS capsule Take 0.4 mg by mouth.    Historical Provider, MD  traZODone (DESYREL) 50 MG tablet  09/23/15   Historical Provider, MD  vitamin B-12 (CYANOCOBALAMIN) 1000 MCG tablet Take 1,000 mcg by mouth daily.    Historical Provider, MD    Allergies  Allergen Reactions  . Statins     Other reaction(s): Muscle Pain    Family History  Problem Relation Age of Onset  . Alzheimer's disease Mother   . Alcohol abuse Father   . Heart disease Father   . Prostate cancer Brother   . Kidney disease Neg Hx     Social History Social History  Substance Use Topics  . Smoking status: Former Smoker    Packs/day: 1.00    Years: 35.00    Types: Cigarettes  . Smokeless tobacco: Never Used     Comment: quit smoking 1992  . Alcohol use No    Review of Systems Constitutional: Subjective fever. Cardiovascular: Negative for chest pain. Respiratory: Negative for shortness of breath. Gastrointestinal: Negative for abdominal pain. Negative for nausea, vomiting, diarrhea. Genitourinary: Negative for dysuria. Musculoskeletal: States mild lower back pain which is chronic and unchanged. Neurological: Negative for headache. Denies focal deficits. 10-point ROS otherwise negative.  ____________________________________________   PHYSICAL EXAM:  Constitutional: Alert and oriented. Well appearing and in no distress. Heart appearing. Eyes: Normal exam ENT   Head: Normocephalic and atraumatic   Mouth/Throat: Mucous membranes are moist. Cardiovascular: Normal rate, regular rhythm. No murmur Respiratory: Normal respiratory effort without tachypnea nor retractions. Breath sounds are clear   Gastrointestinal: Soft and nontender. No distention.   Musculoskeletal: Nontender with normal range of motion in all extremities Neurologic:  Normal speech and language. No gross focal neurologic deficits. 5/5 motor in all extremities, no focal deficits identified. Skin:  Skin is warm, dry and intact.  Psychiatric: Mood and affect are normal. Speech and behavior are normal.   ____________________________________________    EKG  EKG reviewed and interpreted, such as sinus tachycardia 113 bpm, widened QRS, normal axis, normal intervals, nonspecific ST changes without ST elevation.  ____________________________________________    RADIOLOGY  CT head negative  ____________________________________________   INITIAL IMPRESSION / ASSESSMENT AND PLAN / ED COURSE  Pertinent labs & imaging results that were available during my care of the patient were reviewed by me and considered in my medical decision making (see chart for details).  Patient presents the emergency department generalized weakness. He states his symptoms have resolved. Patient has a borderline temperature of 99.7 upon arrival, we will check labs including urinalysis, and a CT scan of the head, chest x-ray, and closely monitor in the emergency department. No focal deficits identified on exam. Overall appears well.   CT scan is negative, EKG shows no acute findings. We will dose IV fluids,  obtain a chest x-ray, labs show no concerning abnormality.  Chest x-ray currently pending. I discussed the results so far with the patient and his wife. The patient states his weakness is gone, he strongly wishes to go home if possible. If his chest x-ray is clear we will attempt to ambulate the patient. The patient is able to ambulate well and feels she can go home we will likely discharge, at the chest x-ray shows signs of infection we will likely admit. Patient care signed out to Dr. Joni Fears, x-ray and ambulation  pending.  ____________________________________________   FINAL CLINICAL IMPRESSION(S) / ED DIAGNOSES  Weakness    Harvest Dark, MD 05/31/16 [phone]

## 2016-05-30 NOTE — ED Notes (Signed)
MD at bedside. 

## 2016-05-30 NOTE — ED Provider Notes (Signed)
Patient reexamined. Symptoms are controlled. Chest x-ray unremarkable. Postvoid residual by bladder scan about 100 cc. Patient ambulatory without difficulty, steady gait. We'll discharge home. Patient is excited about this plan and will follow-up with his doctor in 2 or 3 days.   Carrie Mew, MD 05/30/16 [phone]

## 2016-05-30 NOTE — ED Notes (Signed)
Bladder scan reveals 464cc

## 2016-05-30 NOTE — ED Notes (Signed)
Pt ambulated to door and back in room without difficulty - pt then requested to void - obtained 175cc of clear yellow urine

## 2016-08-28 ENCOUNTER — Ambulatory Visit
Admission: RE | Admit: 2016-08-28 | Discharge: 2016-08-28 | Disposition: A | Discharge status: Home / Self Care | Payer: Medicare Other | Source: Ambulatory Visit | Attending: Urology | Admitting: Urology

## 2016-08-28 DIAGNOSIS — N289 Disorder of kidney and ureter, unspecified: Secondary | ICD-10-CM | POA: Diagnosis not present

## 2016-08-28 DIAGNOSIS — K8020 Calculus of gallbladder without cholecystitis without obstruction: Secondary | ICD-10-CM | POA: Insufficient documentation

## 2016-08-28 DIAGNOSIS — J439 Emphysema, unspecified: Secondary | ICD-10-CM | POA: Insufficient documentation

## 2016-08-28 DIAGNOSIS — I700 Atherosclerosis of aorta: Secondary | ICD-10-CM | POA: Insufficient documentation

## 2016-08-28 DIAGNOSIS — K5730 Diverticulosis of large intestine without perforation or abscess without bleeding: Secondary | ICD-10-CM | POA: Insufficient documentation

## 2016-08-28 HISTORY — DX: Malignant neoplasm of unspecified part of right bronchus or lung: C34.91

## 2016-08-28 HISTORY — DX: Squamous cell carcinoma of skin, unspecified: C44.92

## 2016-08-28 LAB — POCT I-STAT CREATININE: CREATININE: 1.1 mg/dL (ref 0.61–1.24)

## 2016-08-28 MED ORDER — IOPAMIDOL (ISOVUE-370) INJECTION 76%
100.0000 mL | Freq: Once | INTRAVENOUS | Status: AC | PRN
  Administered 2016-08-28: 100 mL via INTRAVENOUS

## 2016-09-03 ENCOUNTER — Telehealth: Payer: Self-pay

## 2016-09-03 NOTE — Telephone Encounter (Signed)
-----   Message from Nori Riis, PA-C sent at 09/03/2016  8:29 AM EST ----- Please notify the patient that his kidneys show no signs of cancer and the nodule in his lung is stable.  I would like to see him in May.

## 2016-09-03 NOTE — Telephone Encounter (Signed)
LMOM

## 2017-01-12 ENCOUNTER — Other Ambulatory Visit: Payer: Self-pay | Admitting: Urology

## 2017-01-12 DIAGNOSIS — N400 Enlarged prostate without lower urinary tract symptoms: Secondary | ICD-10-CM

## 2017-03-03 ENCOUNTER — Ambulatory Visit: Payer: Medicare Other | Admitting: Urology

## 2017-03-09 NOTE — Progress Notes (Signed)
10:58 AM   Roberto Adams 07/14/1935 [id number]  Referring provider: Kirk Ruths, MD [facility] [place], [place] [address]  Chief Complaint  Patient presents with  . Benign Prostatic Hypertrophy    Renal Lesion     1 year follow up     HPI: Patient is a 82 year old Caucasian male who presents today for 12 month follow-up for BPH with LU TS, history of urinary retention, renal lesion and a history of hematuria.  Background history Patient with a history of HTN, CAD, TIA, BPH, HLD presenting today for follow-up after being seen in the emergency department on 10/19/15 for an acute episode of urinary retention. He reports one prior episode of urinary retention post lung surgery years ago. He reports no symptoms of urinary frequency, urgency, hesitancy, gross hematuria or sensation of incomplete bladder emptying at baseline. He does report nocturia twice per night on average.  S/p per patient CVA October 2017.  Former smoker 1 ppd x 40 years.  Patient returned after his last visit for a voiding trial which was ultimately unsuccessful. He was instructed to perform self-catheterization.    He did have an episode of gross hematuria.  He did complete a hematuria work up with CT Urogram and cystoscopy in 11/2015.  A 15 mm enhancing lesion was found in the upper pole of the right kidney.  It was difficult to characterize due to its small size.  A repeated CTU in 08/2016 noted no evidence of renal neoplasm, nephrolithiasis, or hydronephrosis.  Cholelithiasis.  No radiographic evidence of cholecystitis.  Colonic diverticulosis. No radiographic evidence of diverticulitis in visualized portion of the abdomen.  Aortic atherosclerosis.  Bibasilar pulmonary emphysema.  Current therapy includes finasteride and tamsulosin.  Anticoagulation therapy includes aspirin and Plavix  Today, his I PSS score was 8/2.  Patient reports he has no difficulty with urination. He has not  had any episodes of gross hematuria. He is no longer having to catheterize himself.  UA is negative for hematuria on today's exam.  It is positive for > 30 WBC's and nitrite positive.  He caths himself on occasion to make sure he is emptying.        IPSS    Row Name 03/10/17 1000         International Prostate Symptom Score   How often have you had the sensation of not emptying your bladder? More than half the time     How often have you had to urinate less than every two hours? Not at All     How often have you found you stopped and started again several times when you urinated? Not at All     How often have you found it difficult to postpone urination? Not at All     How often have you had a weak urinary stream? Less than 1 in 5 times     How often have you had to strain to start urination? Not at All     How many times did you typically get up at night to urinate? 3 Times     Total IPSS Score 8       Quality of Life due to urinary symptoms   If you were to spend the rest of your life with your urinary condition just the way it is now how would you feel about that? Mostly Satisfied        Score:  1-7 Mild 8-19 Moderate 20-35 Severe  PMH: Past Medical History:  Diagnosis Date  . Amnesia, global, transient 08/20/2015   Overview:  Better at [facility], mri negative. On lipitor and plavix. Only issue is insomnia now.    . Arthritis   . Benign prostatic hyperplasia with urinary obstruction 11/06/2012  . CAD (coronary artery disease)   . Cancer of right lung ([place])    Right Upper lobe resection.   . Cancer of skin, squamous cell   . Elevated prostate specific antigen (PSA) 07/05/2012  . Enlarged prostate   . Hearing aid worn    both ears  . History of lung cancer 2001   Rt upper lobe; s/p chemo and radiation with recurrence  . HLD (hyperlipidemia)   . Hypertension   . Lumbar canal stenosis 01/17/2013  . Mild cognitive disorder 07/22/2014   Last Assessment & Plan:  Memory seems to be  stable   . Nocturia associated with benign prostatic hypertrophy   . Pure hypercholesterolemia 07/22/2014   Last Assessment & Plan:  Has been working on a low fat diet and no myalgia's are present.    Marland Kitchen Spinal stenosis, lumbar region, with neurogenic claudication 01/17/2013  . TIA (transient ischemic attack) 08/08/2015  . Vasomotor rhinitis 08/08/2014    Surgical History: Past Surgical History:  Procedure Laterality Date  . EPIDURAL BLOCK INJECTION    . LUMBAR LAMINECTOMY/DECOMPRESSION MICRODISCECTOMY Right 01/17/2013   Procedure: Right Lumbar Two-three,Lumbar three-four,Lumbar Four-Five Decompressive Lumbar laminectomy;  Surgeon: Charlie Pitter, MD;  Location: [facility];  Service: Neurosurgery;  Laterality: Right;  right  . LUNG CANCER SURGERY Right 2001  . PILONIDAL CYST / SINUS EXCISION  1958  . ROTATOR CUFF REPAIR  [phone]    Home Medications:  Allergies as of 03/10/2017      Reactions   Statins Other (See Comments)   Other reaction(s): Muscle Pain Other reaction(s): Muscle Pain      Medication List       Accurate as of 03/10/17 10:58 AM. Always use your most recent med list.          aspirin 81 MG tablet Take 81 mg by mouth daily.   atorvastatin 40 MG tablet Commonly known as:  LIPITOR Take 1 tablet (40 mg total) by mouth daily at 6 PM.   clopidogrel 75 MG tablet Commonly known as:  PLAVIX Take 1 tablet (75 mg total) by mouth daily.   COMPLETE Tabs Take by mouth.   finasteride 5 MG tablet Commonly known as:  PROSCAR TAKE ONE TABLET BY MOUTH ONCE DAILY   fluorouracil 5 % cream Commonly known as:  EFUDEX Wait until biopsies heal. Apply topically to area on scalp daily for 3 weeks. If this goes okay, try another area on scalp.   HYDROcodone-acetaminophen 5-325 MG tablet Commonly known as:  NORCO/VICODIN Take by mouth.   hydrOXYzine 25 MG tablet Commonly known as:  ATARAX/VISTARIL   lisinopril-hydrochlorothiazide 20-25 MG tablet Commonly known as:   PRINZIDE,ZESTORETIC   lovastatin 10 MG tablet Commonly known as:  MEVACOR Reported on 11/20/2015   tamsulosin 0.4 MG Caps capsule Commonly known as:  FLOMAX Take 0.4 mg by mouth.   traZODone 50 MG tablet Commonly known as:  DESYREL   triamcinolone cream 0.1 % Commonly known as:  KENALOG   vitamin B-12 1000 MCG tablet Commonly known as:  CYANOCOBALAMIN Take 1,000 mcg by mouth daily.       Allergies:  Allergies  Allergen Reactions  . Statins Other (See Comments)    Other reaction(s): Muscle  Pain Other reaction(s): Muscle Pain    Family History: Family History  Problem Relation Age of Onset  . Alzheimer's disease Mother   . Alcohol abuse Father   . Heart disease Father   . Prostate cancer Brother   . Kidney disease Neg Hx   . Kidney cancer Neg Hx   . Bladder Cancer Neg Hx     Social History:  reports that he has quit smoking. His smoking use included Cigarettes. He has a 35.00 pack-year smoking history. He has never used smokeless tobacco. He reports that he does not drink alcohol or use drugs.  ROS: UROLOGY Frequent Urination?: No Hard to postpone urination?: No Burning/pain with urination?: No Get up at night to urinate?: No Leakage of urine?: No Urine stream starts and stops?: No Trouble starting stream?: No Do you have to strain to urinate?: No Blood in urine?: No Urinary tract infection?: No Sexually transmitted disease?: No Injury to kidneys or bladder?: No Painful intercourse?: No Weak stream?: No Erection problems?: No Penile pain?: No  Gastrointestinal Nausea?: No Vomiting?: No Indigestion/heartburn?: No Diarrhea?: No Constipation?: No  Constitutional Fever: No Night sweats?: No Weight loss?: No Fatigue?: No  Skin Skin rash/lesions?: No Itching?: No  Eyes Blurred vision?: No Double vision?: No  Ears/Nose/Throat Sore throat?: No Sinus problems?: No  Hematologic/Lymphatic Swollen glands?: No Easy bruising?:  No  Cardiovascular Leg swelling?: No Chest pain?: No  Respiratory Cough?: No Shortness of breath?: No  Endocrine Excessive thirst?: No  Musculoskeletal Back pain?: No Joint pain?: Yes  Neurological Headaches?: No Dizziness?: No  Psychologic Depression?: No Anxiety?: No  Physical Exam: BP 115/68   Pulse 82   Ht 5' 7.5" (1.715 m)   Wt 201 lb 3.2 oz (91.3 kg)   BMI 31.05 kg/m   Constitutional: Well nourished. Alert and oriented, No acute distress. HEENT:  AT, moist mucus membranes. Trachea midline, no masses. Cardiovascular: No clubbing, cyanosis, or edema. Respiratory: Normal respiratory effort, no increased work of breathing. GI: Abdomen is soft, non tender, non distended, no abdominal masses. Liver and spleen not palpable.  No hernias appreciated.  Stool sample for occult testing is not indicated.   GU: No CVA tenderness.  No bladder fullness or masses.  Patient with uncircumcised phallus.  Foreskin easily retracted   Urethral meatus is patent.  No penile discharge. No penile lesions or rashes. Scrotum without lesions, cysts, rashes and/or edema.  Testicles are located scrotally bilaterally. No masses are appreciated in the testicles. Left and right epididymis are normal. Rectal: Patient with  normal sphincter tone. Anus and perineum without scarring or rashes. No rectal masses are appreciated. Prostate is approximately 60 grams, could not palpated the entire gland due to size, no nodules are appreciated. Seminal vesicles could not be palpated.   Skin: No rashes, bruises or suspicious lesions. Lymph: No cervical or inguinal adenopathy. Neurologic: Grossly intact, no focal deficits, moving all 4 extremities. Psychiatric: Normal mood and affect.  Laboratory Data: Results for orders placed or performed during the hospital encounter of 08/28/16  I-STAT creatinine  Result Value Ref Range   Creatinine, Ser 1.10 0.61 - 1.24 mg/dL     Assessment & Plan:    1. History  of urinary retention:  Resolved at this time.  He will RTC in one year for IPSS and PVR.    2. History of hematuria:  Resolved.  Completed hematuria work up in 2017.    3. BPH with LUTS:   Prostate significant currently enlarged on exam.  I PSS score is 8/2.  We'll continue Flomax and finasteride.  He will RTC in one year for IPSS, exam and PVR.    4. Right renal lesion  - stable on 08/2016 study  - no further surveillance needed  5. Left lower lobe pulmonary nodule  - stable on 08/2016  Return in about 1 year (around 03/10/2018) for UA, and IPSS and exam.  These notes generated with voice recognition software. I apologize for typographical errors.  Zara Council, [facility] [address], [place], [place] [address] [phone]

## 2017-03-10 ENCOUNTER — Ambulatory Visit (INDEPENDENT_AMBULATORY_CARE_PROVIDER_SITE_OTHER): Payer: Medicare Other | Admitting: Urology

## 2017-03-10 ENCOUNTER — Encounter: Payer: Self-pay | Admitting: Urology

## 2017-03-10 VITALS — BP 115/68 | HR 82 | Ht 67.5 in | Wt 201.2 lb

## 2017-03-10 DIAGNOSIS — N289 Disorder of kidney and ureter, unspecified: Secondary | ICD-10-CM | POA: Diagnosis not present

## 2017-03-10 DIAGNOSIS — R911 Solitary pulmonary nodule: Secondary | ICD-10-CM

## 2017-03-10 DIAGNOSIS — Z87898 Personal history of other specified conditions: Secondary | ICD-10-CM | POA: Diagnosis not present

## 2017-03-10 DIAGNOSIS — N401 Enlarged prostate with lower urinary tract symptoms: Secondary | ICD-10-CM

## 2017-03-10 DIAGNOSIS — N138 Other obstructive and reflux uropathy: Secondary | ICD-10-CM

## 2017-03-10 DIAGNOSIS — Z87448 Personal history of other diseases of urinary system: Secondary | ICD-10-CM

## 2017-03-10 LAB — URINALYSIS, COMPLETE
Bilirubin, UA: NEGATIVE
Glucose, UA: NEGATIVE
Ketones, UA: NEGATIVE
NITRITE UA: POSITIVE — AB
Specific Gravity, UA: 1.02 (ref 1.005–1.030)
Urobilinogen, Ur: 0.2 mg/dL (ref 0.2–1.0)
pH, UA: 6.5 (ref 5.0–7.5)

## 2017-03-10 LAB — MICROSCOPIC EXAMINATION
Epithelial Cells (non renal): NONE SEEN /hpf (ref 0–10)
RBC MICROSCOPIC, UA: NONE SEEN /HPF (ref 0–?)
WBC, UA: >30 /hpf — ABNORMAL HIGH (ref 0–?)

## 2017-08-21 ENCOUNTER — Other Ambulatory Visit: Payer: Self-pay | Admitting: Urology

## 2017-08-21 DIAGNOSIS — N400 Enlarged prostate without lower urinary tract symptoms: Secondary | ICD-10-CM

## 2018-03-01 ENCOUNTER — Other Ambulatory Visit: Payer: Self-pay

## 2018-03-01 DIAGNOSIS — N400 Enlarged prostate without lower urinary tract symptoms: Secondary | ICD-10-CM

## 2018-03-01 MED ORDER — FINASTERIDE 5 MG PO TABS: 5.0000 mg | ORAL_TABLET | Freq: Every day | ORAL | 0 refills | Status: DC

## 2018-03-09 NOTE — Progress Notes (Signed)
11:05 AM   Roberto Adams Jan 05, 1935 [id number]  Referring provider: Kirk Ruths, MD [facility] [place], [place] [address]  Chief Complaint  Patient presents with  . Benign Prostatic Hypertrophy    HPI: Patient is a 83 year old Caucasian male who presents today for 12 month follow-up for BPH with LU TS, history of urinary retention and a history of hematuria.  Background history Patient with a history of HTN, CAD, TIA, BPH, HLD presenting today for follow-up after being seen in the emergency department on 10/19/15 for an acute episode of urinary retention. He reports one prior episode of urinary retention post lung surgery years ago. He reports no symptoms of urinary frequency, urgency, hesitancy, gross hematuria or sensation of incomplete bladder emptying at baseline. He does report nocturia twice per night on average.  S/p per patient CVA October 2017.  Former smoker 1 ppd x 40 years.  Patient returned after his voiding trial which was ultimately unsuccessful. He was instructed to perform self-catheterization.    He did have an episode of gross hematuria.  He completed a hematuria work up with CT Urogram and cystoscopy in 11/2015.  A 15 mm enhancing lesion was found in the upper pole of the right kidney.  It was difficult to characterize due to its small size.  A repeated CTU in 08/2016 noted no evidence of renal neoplasm, nephrolithiasis, or hydronephrosis.  Cholelithiasis.  No radiographic evidence of cholecystitis.  Colonic diverticulosis. No radiographic evidence of diverticulitis in visualized portion of the abdomen.  Aortic atherosclerosis.  Bibasilar pulmonary emphysema.  Current therapy includes finasteride  Anticoagulation therapy includes aspirin and Plavix  Today, his I PSS score was 8/2.  His PVR is 82 mL.  His previous I PSS score was 8/2.  Patient reports he has no difficulty with urination. He has not had any episodes of gross  hematuria.  He is no longer having to catheterize himself.   IPSS    Row Name 03/10/18 1000         International Prostate Symptom Score   How often have you had the sensation of not emptying your bladder?  Less than half the time     How often have you had to urinate less than every two hours?  Less than 1 in 5 times     How often have you found you stopped and started again several times when you urinated?  Not at All     How often have you found it difficult to postpone urination?  Not at All     How often have you had a weak urinary stream?  Less than 1 in 5 times     How often have you had to strain to start urination?  Less than 1 in 5 times     How many times did you typically get up at night to urinate?  3 Times     Total IPSS Score  8       Quality of Life due to urinary symptoms   If you were to spend the rest of your life with your urinary condition just the way it is now how would you feel about that?  Mostly Satisfied        Score:  1-7 Mild 8-19 Moderate 20-35 Severe  PMH: Past Medical History:  Diagnosis Date  . Amnesia, global, transient 08/20/2015   Overview:  Better at [facility], mri negative. On lipitor and plavix. Only issue is insomnia  now.    . Arthritis   . Benign prostatic hyperplasia with urinary obstruction 11/06/2012  . CAD (coronary artery disease)   . Cancer of right lung ([place])    Right Upper lobe resection.   . Cancer of skin, squamous cell   . Elevated prostate specific antigen (PSA) 07/05/2012  . Enlarged prostate   . Hearing aid worn    both ears  . History of lung cancer 2001   Rt upper lobe; s/p chemo and radiation with recurrence  . HLD (hyperlipidemia)   . Hypertension   . Lumbar canal stenosis 01/17/2013  . Mild cognitive disorder 07/22/2014   Last Assessment & Plan:  Memory seems to be stable   . Nocturia associated with benign prostatic hypertrophy   . Pure hypercholesterolemia 07/22/2014   Last Assessment & Plan:  Has been working on a  low fat diet and no myalgia's are present.    Marland Kitchen Spinal stenosis, lumbar region, with neurogenic claudication 01/17/2013  . TIA (transient ischemic attack) 08/08/2015  . Vasomotor rhinitis 08/08/2014    Surgical History: Past Surgical History:  Procedure Laterality Date  . EPIDURAL BLOCK INJECTION    . LUMBAR LAMINECTOMY/DECOMPRESSION MICRODISCECTOMY Right 01/17/2013   Procedure: Right Lumbar Two-three,Lumbar three-four,Lumbar Four-Five Decompressive Lumbar laminectomy;  Surgeon: Charlie Pitter, MD;  Location: [facility];  Service: Neurosurgery;  Laterality: Right;  right  . LUNG CANCER SURGERY Right 2001  . PILONIDAL CYST / SINUS EXCISION  1958  . ROTATOR CUFF REPAIR  [phone]    Home Medications:  Allergies as of 03/10/2018      Reactions   Statins Other (See Comments)   Other reaction(s): Muscle Pain Other reaction(s): Muscle Pain      Medication List        Accurate as of 03/10/18 11:05 AM. Always use your most recent med list.          aspirin 81 MG tablet Take 81 mg by mouth daily.   atorvastatin 40 MG tablet Commonly known as:  LIPITOR Take 1 tablet (40 mg total) by mouth daily at 6 PM.   clopidogrel 75 MG tablet Commonly known as:  PLAVIX Take 1 tablet (75 mg total) by mouth daily.   COMPLETE Tabs Take by mouth.   finasteride 5 MG tablet Commonly known as:  PROSCAR Take 1 tablet (5 mg total) by mouth daily.   fluorouracil 5 % cream Commonly known as:  EFUDEX Wait until biopsies heal. Apply topically to area on scalp daily for 3 weeks. If this goes okay, try another area on scalp.   HYDROcodone-acetaminophen 5-325 MG tablet Commonly known as:  NORCO/VICODIN Take by mouth.   hydrOXYzine 25 MG tablet Commonly known as:  ATARAX/VISTARIL   lisinopril-hydrochlorothiazide 20-25 MG tablet Commonly known as:  PRINZIDE,ZESTORETIC   lovastatin 10 MG tablet Commonly known as:  MEVACOR Reported on 11/20/2015   tamsulosin 0.4 MG Caps capsule Commonly known as:   FLOMAX Take 0.4 mg by mouth.   traZODone 50 MG tablet Commonly known as:  DESYREL   triamcinolone cream 0.1 % Commonly known as:  KENALOG   vitamin B-12 1000 MCG tablet Commonly known as:  CYANOCOBALAMIN Take 1,000 mcg by mouth daily.       Allergies:  Allergies  Allergen Reactions  . Statins Other (See Comments)    Other reaction(s): Muscle Pain Other reaction(s): Muscle Pain    Family History: Family History  Problem Relation Age of Onset  . Alzheimer's disease Mother   . Alcohol  abuse Father   . Heart disease Father   . Prostate cancer Brother   . Kidney disease Neg Hx   . Kidney cancer Neg Hx   . Bladder Cancer Neg Hx     Social History:  reports that he has quit smoking. His smoking use included cigarettes. He has a 35.00 pack-year smoking history. He has never used smokeless tobacco. He reports that he does not drink alcohol or use drugs.  ROS: UROLOGY Frequent Urination?: No Hard to postpone urination?: No Burning/pain with urination?: No Get up at night to urinate?: No Leakage of urine?: No Urine stream starts and stops?: No Trouble starting stream?: No Do you have to strain to urinate?: No Blood in urine?: No Urinary tract infection?: No Sexually transmitted disease?: No Injury to kidneys or bladder?: No Painful intercourse?: No Weak stream?: No Erection problems?: No Penile pain?: No  Gastrointestinal Nausea?: No Vomiting?: No Indigestion/heartburn?: No Diarrhea?: No Constipation?: No  Constitutional Fever: No Night sweats?: No Weight loss?: No Fatigue?: No  Skin Skin rash/lesions?: Yes Itching?: No  Eyes Blurred vision?: No Double vision?: No  Ears/Nose/Throat Sore throat?: No Sinus problems?: No  Hematologic/Lymphatic Swollen glands?: No Easy bruising?: Yes  Cardiovascular Leg swelling?: No Chest pain?: No  Respiratory Cough?: Yes Shortness of breath?: No  Endocrine Excessive thirst?:  No  Musculoskeletal Back pain?: No Joint pain?: No  Neurological Headaches?: No Dizziness?: No  Psychologic Depression?: No Anxiety?: No  Physical Exam: BP 114/70   Pulse 89   Ht 5' 7.5" (1.715 m)   Wt 200 lb 14.4 oz (91.1 kg)   BMI 31.00 kg/m   Constitutional: Well nourished. Alert and oriented, No acute distress. HEENT: [place] AT, moist mucus membranes. Trachea midline, no masses. Cardiovascular: No clubbing, cyanosis, or edema. Respiratory: Normal respiratory effort, no increased work of breathing. GI: Abdomen is soft, non tender, non distended, no abdominal masses. Liver and spleen not palpable.  No hernias appreciated.  Stool sample for occult testing is not indicated.   GU: No CVA tenderness.  No bladder fullness or masses.  Patient with uncircumcised phallus.  Foreskin easily retracted  Urethral meatus is patent.  No penile discharge. No penile lesions or rashes. Scrotum without lesions, cysts, rashes and/or edema.  Testicles are located scrotally bilaterally. No masses are appreciated in the testicles. Left and right epididymis are normal. Rectal: Patient with  normal sphincter tone. Anus and perineum without scarring or rashes. No rectal masses are appreciated. Prostate is approximately 60 grams, *could not palpate the entire gland due to size, no nodules are appreciated. Seminal vesicles are normal. Skin: No rashes, bruises or suspicious lesions. Lymph: No cervical or inguinal adenopathy. Neurologic: Grossly intact, no focal deficits, moving all 4 extremities. Psychiatric: Normal mood and affect.   Laboratory Data: N/A  I have reviewed the labs  Pertinent Imaging Results for Roberto Adams, Roberto Adams (MRN [id number]) as of 03/10/2018 10:51  Ref. Range 03/10/2018 10:35  Scan Result Unknown 82    Assessment & Plan:    1. History of urinary retention:  Resolved at this time.  He will RTC in one year for IPSS and PVR.    2. History of hematuria:  No reports of gross  hematuria.  Completed hematuria work up in 2017 - no worrisome findings   3. BPH with LUTS:   Prostate significant currently enlarged on exam. I PSS score is 8/2 ,which is stable.   We'll continue finasteride.  He will RTC in one year for IPSS, exam  and PVR.     Return in about 1 year (around 03/11/2019) for IPSS, PVR and exam.  These notes generated with voice recognition software. I apologize for typographical errors.  Zara Council, PA-C  [facility] [address] [place], [place] [address] [phone]

## 2018-03-10 ENCOUNTER — Ambulatory Visit: Payer: Medicare Other | Admitting: Urology

## 2018-03-10 ENCOUNTER — Encounter: Payer: Self-pay | Admitting: Urology

## 2018-03-10 VITALS — BP 114/70 | HR 89 | Ht 67.5 in | Wt 200.9 lb

## 2018-03-10 DIAGNOSIS — N138 Other obstructive and reflux uropathy: Secondary | ICD-10-CM

## 2018-03-10 DIAGNOSIS — N401 Enlarged prostate with lower urinary tract symptoms: Secondary | ICD-10-CM | POA: Diagnosis not present

## 2018-03-10 DIAGNOSIS — N400 Enlarged prostate without lower urinary tract symptoms: Secondary | ICD-10-CM | POA: Diagnosis not present

## 2018-03-10 DIAGNOSIS — Z87898 Personal history of other specified conditions: Secondary | ICD-10-CM | POA: Diagnosis not present

## 2018-03-10 DIAGNOSIS — Z87448 Personal history of other diseases of urinary system: Secondary | ICD-10-CM | POA: Diagnosis not present

## 2018-03-10 LAB — BLADDER SCAN AMB NON-IMAGING: SCAN RESULT: 82

## 2018-03-10 MED ORDER — FINASTERIDE 5 MG PO TABS: 5.0000 mg | ORAL_TABLET | Freq: Every day | ORAL | 3 refills | Status: DC

## 2019-03-14 ENCOUNTER — Ambulatory Visit: Payer: Medicare Other | Admitting: Urology

## 2019-04-18 ENCOUNTER — Other Ambulatory Visit: Payer: Self-pay | Admitting: Urology

## 2019-04-18 DIAGNOSIS — N400 Enlarged prostate without lower urinary tract symptoms: Secondary | ICD-10-CM

## 2019-04-19 NOTE — Progress Notes (Signed)
2:09 PM   Roberto Adams Dec 25, 1934 [id number]  Referring provider: Kirk Ruths, MD [facility],  [place] [address]  Chief Complaint  Patient presents with   Benign Prostatic Hypertrophy    HPI: Patient is a 84 year old male who presents today for 12 month follow-up for BPH with LU TS, history of urinary retention and a history of hematuria.  BPH WITH LUTS  (prostate and/or bladder) IPSS score: 6/3     PVR: 101 mL Previous score: 8/2   Previous PVR: 82 mL  Major complaint(s):  Frequency, urgency and nocturia  x one year. Denies any dysuria, hematuria or suprapubic pain.   Currently taking: finasteride 5 mg daily   Denies any recent fevers, chills, nausea or vomiting.   IPSS    Row Name 04/20/19 1300         International Prostate Symptom Score   How often have you had the sensation of not emptying your bladder?  Less than half the time     How often have you had to urinate less than every two hours?  Not at All     How often have you found you stopped and started again several times when you urinated?  Not at All     How often have you found it difficult to postpone urination?  Not at All     How often have you had a weak urinary stream?  Less than 1 in 5 times     How often have you had to strain to start urination?  Not at All     How many times did you typically get up at night to urinate?  3 Times     Total IPSS Score  6       Quality of Life due to urinary symptoms   If you were to spend the rest of your life with your urinary condition just the way it is now how would you feel about that?  Mixed        Score:  1-7 Mild 8-19 Moderate 20-35 Severe   History of urinary retention Patient with a history of HTN, CAD, TIA, BPH, HLD presenting today for follow-up after being seen in the emergency department on 10/19/15 for an acute episode of urinary retention. He reports one prior episode of urinary retention post lung  surgery years ago. S/p per patient CVA October 2017.    History of hematuria (high risk) ormer smoker 1 ppd x 40 years.  He completed a hematuria work up with CT Urogram and cystoscopy in 11/2015.  A 15 mm enhancing lesion was found in the upper pole of the right kidney.  It was difficult to characterize due to its small size.  A repeated CTU in 08/2016 noted no evidence of renal neoplasm, nephrolithiasis, or hydronephrosis.  Cholelithiasis.  No radiographic evidence of cholecystitis.  Colonic diverticulosis. No radiographic evidence of diverticulitis in visualized portion of the abdomen.  Aortic atherosclerosis.  Bibasilar pulmonary emphysema.  Cystoscopy in 2017 with Dr. Alyson Ingles was NED.  He denies any blood in his urine.   PMH: Past Medical History:  Diagnosis Date   Amnesia, global, transient 08/20/2015   Overview:  Better at [facility], mri negative. On lipitor and plavix. Only issue is insomnia now.     Arthritis    Benign prostatic hyperplasia with urinary obstruction 11/06/2012   CAD (coronary artery disease)    Cancer of right lung ([place])  Right Upper lobe resection.    Cancer of skin, squamous cell    Elevated prostate specific antigen (PSA) 07/05/2012   Enlarged prostate    Hearing aid worn    both ears   History of lung cancer 2001   Rt upper lobe; s/p chemo and radiation with recurrence   HLD (hyperlipidemia)    Hypertension    Lumbar canal stenosis 01/17/2013   Mild cognitive disorder 07/22/2014   Last Assessment & Plan:  Memory seems to be stable    Nocturia associated with benign prostatic hypertrophy    Pure hypercholesterolemia 07/22/2014   Last Assessment & Plan:  Has been working on a low fat diet and no myalgia's are present.     Spinal stenosis, lumbar region, with neurogenic claudication 01/17/2013   TIA (transient ischemic attack) 08/08/2015   Vasomotor rhinitis 08/08/2014    Surgical History: Past Surgical History:  Procedure Laterality Date    EPIDURAL BLOCK INJECTION     LUMBAR LAMINECTOMY/DECOMPRESSION MICRODISCECTOMY Right 01/17/2013   Procedure: Right Lumbar Two-three,Lumbar three-four,Lumbar Four-Five Decompressive Lumbar laminectomy;  Surgeon: Charlie Pitter, MD;  Location: [place] [facility];  Service: Neurosurgery;  Laterality: Right;  right   LUNG CANCER SURGERY Right 2001   PILONIDAL CYST / SINUS EXCISION  1958   ROTATOR CUFF REPAIR  [phone]    Home Medications:  Allergies as of 04/20/2019      Reactions   Statins Other (See Comments)   Other reaction(s): Muscle Pain Other reaction(s): Muscle Pain      Medication List       Accurate as of April 20, 2019  2:09 PM. If you have any questions, ask your nurse or doctor.        aspirin 81 MG tablet Take 81 mg by mouth daily.   atorvastatin 40 MG tablet Commonly known as: LIPITOR Take 1 tablet (40 mg total) by mouth daily at 6 PM.   clopidogrel 75 MG tablet Commonly known as: Plavix Take 1 tablet (75 mg total) by mouth daily.   Complete Tabs Take by mouth.   finasteride 5 MG tablet Commonly known as: PROSCAR Take 1 tablet by mouth once daily   fluorouracil 5 % cream Commonly known as: EFUDEX Wait until biopsies heal. Apply topically to area on scalp daily for 3 weeks. If this goes okay, try another area on scalp.   HYDROcodone-acetaminophen 5-325 MG tablet Commonly known as: NORCO/VICODIN Take by mouth.   hydrOXYzine 25 MG tablet Commonly known as: ATARAX/VISTARIL   lisinopril-hydrochlorothiazide 20-25 MG tablet Commonly known as: ZESTORETIC   lovastatin 10 MG tablet Commonly known as: MEVACOR Reported on 11/20/2015   tamsulosin 0.4 MG Caps capsule Commonly known as: FLOMAX Take 0.4 mg by mouth. What changed: Another medication with the same name was added. Make sure you understand how and when to take each. Changed by: Zara Council, PA-C   tamsulosin 0.4 MG Caps capsule Commonly known as: FLOMAX Take 1 capsule (0.4 mg total) by mouth  daily. What changed: You were already taking a medication with the same name, and this prescription was added. Make sure you understand how and when to take each. Changed by: Zara Council, PA-C   traZODone 50 MG tablet Commonly known as: DESYREL   triamcinolone cream 0.1 % Commonly known as: KENALOG   vitamin B-12 1000 MCG tablet Commonly known as: CYANOCOBALAMIN Take 1,000 mcg by mouth daily.       Allergies:  Allergies  Allergen Reactions   Statins Other (See Comments)  Other reaction(s): Muscle Pain Other reaction(s): Muscle Pain    Family History: Family History  Problem Relation Age of Onset   Alzheimer's disease Mother    Alcohol abuse Father    Heart disease Father    Prostate cancer Brother    Kidney disease Neg Hx    Kidney cancer Neg Hx    Bladder Cancer Neg Hx     Social History:  reports that he has quit smoking. His smoking use included cigarettes. He has a 35.00 pack-year smoking history. He has never used smokeless tobacco. He reports that he does not drink alcohol or use drugs.  ROS: UROLOGY Frequent Urination?: Yes Hard to postpone urination?: Yes Burning/pain with urination?: No Get up at night to urinate?: Yes Leakage of urine?: No Urine stream starts and stops?: No Trouble starting stream?: No Do you have to strain to urinate?: No Blood in urine?: No Urinary tract infection?: No Sexually transmitted disease?: No Injury to kidneys or bladder?: No Painful intercourse?: No Weak stream?: No Erection problems?: No Penile pain?: No  Gastrointestinal Nausea?: No Vomiting?: No Indigestion/heartburn?: No Diarrhea?: No Constipation?: No  Constitutional Fever: No Night sweats?: No Weight loss?: No Fatigue?: No  Skin Skin rash/lesions?: No Itching?: No  Eyes Blurred vision?: No Double vision?: No  Ears/Nose/Throat Sore throat?: No Sinus problems?: No  Hematologic/Lymphatic Swollen glands?: No Easy bruising?:  No  Cardiovascular Leg swelling?: No Chest pain?: No  Respiratory Cough?: No Shortness of breath?: No  Endocrine Excessive thirst?: Yes  Musculoskeletal Back pain?: Yes Joint pain?: Yes  Neurological Headaches?: No Dizziness?: No  Psychologic Depression?: No Anxiety?: No  Physical Exam: BP (!) 145/78 (BP Location: Left Arm, Patient Position: Sitting, Cuff Size: Normal)    Pulse (!) 101    Ht 5' 7.5" (1.715 m)    BMI 31.00 kg/m   Constitutional:  Well nourished. Alert and oriented, No acute distress. HEENT: [place] AT, moist mucus membranes.  Trachea midline, no masses. Cardiovascular: No clubbing, cyanosis, or edema. Respiratory: Normal respiratory effort, no increased work of breathing. Neurologic: Grossly intact, no focal deficits, moving all 4 extremities. Psychiatric: Normal mood and affect.  I have reviewed the labs  Pertinent Imaging Results for DIMITRIOUS, MICCICHE (MRN [id number]) as of 04/20/2019 14:06  Ref. Range 04/20/2019 13:51  Scan Result Unknown 101     Assessment & Plan:    1. History of urinary retention Resolved    2. History of hematuria Completed hematuria work up in 2017 - no worrisome findings  No reports of gross hematuria  3. BPH with LU TS IPSS score is 6/3, it is slightly improved Continue conservative management, avoiding bladder irritants and timed voiding's Most bothersome symptoms is/are nocturia Initiate alpha-blocker (tamsulosin) discussed side effects  Continue finasteride 5 mg daily; refills given RTC in one month for I PSS and PVR     Return in about 1 month (around 05/21/2019) for IPSS and PVR.  These notes generated with voice recognition software. I apologize for typographical errors.  Zara Council, PA-C  [facility] [address] [place], [place] [address] [phone]

## 2019-04-20 ENCOUNTER — Ambulatory Visit (INDEPENDENT_AMBULATORY_CARE_PROVIDER_SITE_OTHER): Payer: Medicare Other | Admitting: Urology

## 2019-04-20 ENCOUNTER — Other Ambulatory Visit: Payer: Self-pay

## 2019-04-20 ENCOUNTER — Encounter: Payer: Self-pay | Admitting: Urology

## 2019-04-20 VITALS — BP 145/78 | HR 101 | Ht 67.5 in

## 2019-04-20 DIAGNOSIS — N401 Enlarged prostate with lower urinary tract symptoms: Secondary | ICD-10-CM

## 2019-04-20 DIAGNOSIS — Z87898 Personal history of other specified conditions: Secondary | ICD-10-CM

## 2019-04-20 DIAGNOSIS — N138 Other obstructive and reflux uropathy: Secondary | ICD-10-CM | POA: Diagnosis not present

## 2019-04-20 DIAGNOSIS — Z87448 Personal history of other diseases of urinary system: Secondary | ICD-10-CM | POA: Diagnosis not present

## 2019-04-20 LAB — BLADDER SCAN AMB NON-IMAGING: Scan Result: 101

## 2019-04-20 MED ORDER — TAMSULOSIN HCL 0.4 MG PO CAPS: 0.4000 mg | ORAL_CAPSULE | Freq: Every day | ORAL | 3 refills | Status: AC

## 2019-05-07 ENCOUNTER — Observation Stay: Payer: Medicare Other

## 2019-05-07 ENCOUNTER — Emergency Department: Payer: Medicare Other

## 2019-05-07 ENCOUNTER — Other Ambulatory Visit: Payer: Self-pay

## 2019-05-07 ENCOUNTER — Encounter: Payer: Self-pay | Admitting: Emergency Medicine

## 2019-05-07 ENCOUNTER — Observation Stay
Admission: EM | Admit: 2019-05-07 | Discharge: 2019-05-08 | Disposition: A | Discharge status: Home / Self Care | Payer: Medicare Other | Attending: Specialist | Admitting: Specialist

## 2019-05-07 DIAGNOSIS — R262 Difficulty in walking, not elsewhere classified: Secondary | ICD-10-CM | POA: Diagnosis not present

## 2019-05-07 DIAGNOSIS — R338 Other retention of urine: Secondary | ICD-10-CM | POA: Diagnosis not present

## 2019-05-07 DIAGNOSIS — I350 Nonrheumatic aortic (valve) stenosis: Secondary | ICD-10-CM | POA: Diagnosis not present

## 2019-05-07 DIAGNOSIS — M6281 Muscle weakness (generalized): Secondary | ICD-10-CM | POA: Insufficient documentation

## 2019-05-07 DIAGNOSIS — G459 Transient cerebral ischemic attack, unspecified: Secondary | ICD-10-CM

## 2019-05-07 DIAGNOSIS — N390 Urinary tract infection, site not specified: Secondary | ICD-10-CM | POA: Insufficient documentation

## 2019-05-07 DIAGNOSIS — M1990 Unspecified osteoarthritis, unspecified site: Secondary | ICD-10-CM | POA: Diagnosis not present

## 2019-05-07 DIAGNOSIS — R2681 Unsteadiness on feet: Secondary | ICD-10-CM | POA: Insufficient documentation

## 2019-05-07 DIAGNOSIS — E7800 Pure hypercholesterolemia, unspecified: Secondary | ICD-10-CM | POA: Insufficient documentation

## 2019-05-07 DIAGNOSIS — J189 Pneumonia, unspecified organism: Secondary | ICD-10-CM | POA: Insufficient documentation

## 2019-05-07 DIAGNOSIS — Z85118 Personal history of other malignant neoplasm of bronchus and lung: Secondary | ICD-10-CM | POA: Insufficient documentation

## 2019-05-07 DIAGNOSIS — Z7902 Long term (current) use of antithrombotics/antiplatelets: Secondary | ICD-10-CM | POA: Insufficient documentation

## 2019-05-07 DIAGNOSIS — F0390 Unspecified dementia without behavioral disturbance: Secondary | ICD-10-CM | POA: Insufficient documentation

## 2019-05-07 DIAGNOSIS — I10 Essential (primary) hypertension: Secondary | ICD-10-CM | POA: Insufficient documentation

## 2019-05-07 DIAGNOSIS — I050 Rheumatic mitral stenosis: Secondary | ICD-10-CM | POA: Insufficient documentation

## 2019-05-07 DIAGNOSIS — E785 Hyperlipidemia, unspecified: Secondary | ICD-10-CM | POA: Diagnosis not present

## 2019-05-07 DIAGNOSIS — I6521 Occlusion and stenosis of right carotid artery: Secondary | ICD-10-CM | POA: Diagnosis not present

## 2019-05-07 DIAGNOSIS — I2510 Atherosclerotic heart disease of native coronary artery without angina pectoris: Secondary | ICD-10-CM | POA: Diagnosis not present

## 2019-05-07 DIAGNOSIS — Z1159 Encounter for screening for other viral diseases: Secondary | ICD-10-CM | POA: Diagnosis not present

## 2019-05-07 DIAGNOSIS — Z87891 Personal history of nicotine dependence: Secondary | ICD-10-CM | POA: Diagnosis not present

## 2019-05-07 DIAGNOSIS — R4182 Altered mental status, unspecified: Secondary | ICD-10-CM | POA: Diagnosis present

## 2019-05-07 DIAGNOSIS — R569 Unspecified convulsions: Secondary | ICD-10-CM | POA: Insufficient documentation

## 2019-05-07 DIAGNOSIS — J90 Pleural effusion, not elsewhere classified: Secondary | ICD-10-CM

## 2019-05-07 DIAGNOSIS — Z7982 Long term (current) use of aspirin: Secondary | ICD-10-CM | POA: Insufficient documentation

## 2019-05-07 DIAGNOSIS — Z888 Allergy status to other drugs, medicaments and biological substances status: Secondary | ICD-10-CM | POA: Diagnosis not present

## 2019-05-07 DIAGNOSIS — R2990 Unspecified symptoms and signs involving the nervous system: Secondary | ICD-10-CM | POA: Diagnosis present

## 2019-05-07 LAB — COMPREHENSIVE METABOLIC PANEL
ALT: 11 U/L (ref 0–44)
AST: 26 U/L (ref 15–41)
Albumin: 3.9 g/dL (ref 3.5–5.0)
Alkaline Phosphatase: 87 U/L (ref 38–126)
Anion gap: 11 (ref 5–15)
BUN: 16 mg/dL (ref 8–23)
CO2: 26 mmol/L (ref 22–32)
Calcium: 9.4 mg/dL (ref 8.9–10.3)
Chloride: 102 mmol/L (ref 98–111)
Creatinine, Ser: 0.97 mg/dL (ref 0.61–1.24)
GFR calc Af Amer: >60 mL/min (ref >60)
GFR calc non Af Amer: >60 mL/min (ref >60)
Glucose, Bld: 101 mg/dL — ABNORMAL HIGH (ref 70–99)
Potassium: 3.3 mmol/L — ABNORMAL LOW (ref 3.5–5.1)
Sodium: 139 mmol/L (ref 135–145)
Total Bilirubin: 1.1 mg/dL (ref 0.3–1.2)
Total Protein: 7.9 g/dL (ref 6.5–8.1)

## 2019-05-07 LAB — URINALYSIS, COMPLETE (UACMP) WITH MICROSCOPIC
Bilirubin Urine: NEGATIVE
Glucose, UA: NEGATIVE mg/dL
Hgb urine dipstick: NEGATIVE
Ketones, ur: NEGATIVE mg/dL
Nitrite: POSITIVE — AB
Protein, ur: NEGATIVE mg/dL
Specific Gravity, Urine: 1.018 (ref 1.005–1.030)
Squamous Epithelial / HPF: NONE SEEN (ref 0–5)
WBC, UA: >50 WBC/hpf — ABNORMAL HIGH (ref 0–5)
pH: 6 (ref 5.0–8.0)

## 2019-05-07 LAB — CBC WITH DIFFERENTIAL/PLATELET
Abs Immature Granulocytes: 0.06 10*3/uL (ref 0.00–0.07)
Basophils Absolute: 0 10*3/uL (ref 0.0–0.1)
Basophils Relative: 0 %
Eosinophils Absolute: 0 10*3/uL (ref 0.0–0.5)
Eosinophils Relative: 1 %
HCT: 37.9 % — ABNORMAL LOW (ref 39.0–52.0)
Hemoglobin: 12.8 g/dL — ABNORMAL LOW (ref 13.0–17.0)
Immature Granulocytes: 1 %
Lymphocytes Relative: 14 %
Lymphs Abs: 1 10*3/uL (ref 0.7–4.0)
MCH: 28.4 pg (ref 26.0–34.0)
MCHC: 33.8 g/dL (ref 30.0–36.0)
MCV: 84 fL (ref 80.0–100.0)
Monocytes Absolute: 1 10*3/uL (ref 0.1–1.0)
Monocytes Relative: 13 %
Neutro Abs: 5.4 10*3/uL (ref 1.7–7.7)
Neutrophils Relative %: 71 %
Platelets: 181 10*3/uL (ref 150–400)
RBC: 4.51 MIL/uL (ref 4.22–5.81)
RDW: 14.2 % (ref 11.5–15.5)
WBC: 7.5 10*3/uL (ref 4.0–10.5)
nRBC: 0 % (ref 0.0–0.2)

## 2019-05-07 LAB — BRAIN NATRIURETIC PEPTIDE: B Natriuretic Peptide: 165 pg/mL — ABNORMAL HIGH (ref 0.0–100.0)

## 2019-05-07 LAB — SARS CORONAVIRUS 2 BY RT PCR (HOSPITAL ORDER, PERFORMED IN ~~LOC~~ HOSPITAL LAB): SARS Coronavirus 2: NEGATIVE

## 2019-05-07 MED ORDER — STROKE: EARLY STAGES OF RECOVERY BOOK
Freq: Once | Status: AC
  Administered 2019-05-07: 16:00:00

## 2019-05-07 MED ORDER — CLOPIDOGREL BISULFATE 75 MG PO TABS
75.0000 mg | ORAL_TABLET | Freq: Every day | ORAL | Status: DC
  Administered 2019-05-07 – 2019-05-08 (×2): 75 mg via ORAL
  Filled 2019-05-07 (×2): qty 1

## 2019-05-07 MED ORDER — HYDROCHLOROTHIAZIDE 25 MG PO TABS
25.0000 mg | ORAL_TABLET | Freq: Every day | ORAL | Status: DC
  Administered 2019-05-08: 25 mg via ORAL
  Filled 2019-05-07: qty 1

## 2019-05-07 MED ORDER — LISINOPRIL 20 MG PO TABS
20.0000 mg | ORAL_TABLET | Freq: Every day | ORAL | Status: DC
  Administered 2019-05-08: 20 mg via ORAL
  Filled 2019-05-07: qty 1

## 2019-05-07 MED ORDER — LISINOPRIL-HYDROCHLOROTHIAZIDE 20-25 MG PO TABS: 1.0000 | ORAL_TABLET | Freq: Every day | ORAL | Status: DC

## 2019-05-07 MED ORDER — VITAMIN B-12 1000 MCG PO TABS
1000.0000 ug | ORAL_TABLET | Freq: Every day | ORAL | Status: DC
  Administered 2019-05-07 – 2019-05-08 (×2): 1000 ug via ORAL
  Filled 2019-05-07 (×2): qty 1

## 2019-05-07 MED ORDER — TAMSULOSIN HCL 0.4 MG PO CAPS
0.4000 mg | ORAL_CAPSULE | Freq: Every day | ORAL | Status: DC
  Administered 2019-05-07 – 2019-05-08 (×2): 0.4 mg via ORAL
  Filled 2019-05-07 (×2): qty 1

## 2019-05-07 MED ORDER — ACETAMINOPHEN 160 MG/5ML PO SOLN
650.0000 mg | ORAL | Status: DC | PRN
  Filled 2019-05-07: qty 20.3

## 2019-05-07 MED ORDER — FUROSEMIDE 10 MG/ML IJ SOLN
20.0000 mg | Freq: Once | INTRAMUSCULAR | Status: AC
  Administered 2019-05-07: 20 mg via INTRAVENOUS
  Filled 2019-05-07: qty 2

## 2019-05-07 MED ORDER — SENNOSIDES-DOCUSATE SODIUM 8.6-50 MG PO TABS: 1.0000 | ORAL_TABLET | Freq: Every evening | ORAL | Status: DC | PRN

## 2019-05-07 MED ORDER — FINASTERIDE 5 MG PO TABS
5.0000 mg | ORAL_TABLET | Freq: Every day | ORAL | Status: DC
  Administered 2019-05-07 – 2019-05-08 (×2): 5 mg via ORAL
  Filled 2019-05-07 (×2): qty 1

## 2019-05-07 MED ORDER — ASPIRIN 81 MG PO CHEW
81.0000 mg | CHEWABLE_TABLET | Freq: Every day | ORAL | Status: DC
  Administered 2019-05-07 – 2019-05-08 (×2): 81 mg via ORAL
  Filled 2019-05-07 (×2): qty 1

## 2019-05-07 MED ORDER — ENOXAPARIN SODIUM 40 MG/0.4ML ~~LOC~~ SOLN
40.0000 mg | SUBCUTANEOUS | Status: DC
  Administered 2019-05-07: 40 mg via SUBCUTANEOUS
  Filled 2019-05-07: qty 0.4

## 2019-05-07 MED ORDER — ACETAMINOPHEN 325 MG PO TABS: 650.0000 mg | ORAL_TABLET | ORAL | Status: DC | PRN

## 2019-05-07 MED ORDER — IOHEXOL 350 MG/ML SOLN
75.0000 mL | Freq: Once | INTRAVENOUS | Status: AC | PRN
  Administered 2019-05-07: 75 mL via INTRAVENOUS

## 2019-05-07 MED ORDER — ACETAMINOPHEN 650 MG RE SUPP: 650.0000 mg | RECTAL | Status: DC | PRN

## 2019-05-07 MED ORDER — POTASSIUM CHLORIDE CRYS ER 20 MEQ PO TBCR
40.0000 meq | EXTENDED_RELEASE_TABLET | Freq: Once | ORAL | Status: AC
  Administered 2019-05-07: 15:00:00 40 meq via ORAL
  Filled 2019-05-07: qty 2

## 2019-05-07 MED ORDER — SODIUM CHLORIDE 0.9 % IV BOLUS
500.0000 mL | Freq: Once | INTRAVENOUS | Status: AC
  Administered 2019-05-07: 12:00:00 500 mL via INTRAVENOUS

## 2019-05-07 NOTE — Plan of Care (Signed)
Pt admitted from the ED.  VSS.  No signs of pain.  NIH 6.  No neuro changes since admission.  Pt extremely HOH.  Repeated commands/cues and hands used by staff to assist with communication.  Pt is able to read some words, but unable to identify objects.  At times able to say a short sentence. Pt is calm and cooperative. Ate dinner. 2xassist to bsc.  Pt transferred to MRI. Bilateral hearing aids placed in dentures cup on counter in pt's room along with glasses.

## 2019-05-07 NOTE — ED Triage Notes (Signed)
Per family pt has increased confusion. Hx dementia with ocassional aggitation worse today. Pt ambulated to ems. Denies pain

## 2019-05-07 NOTE — ED Notes (Signed)
Neuro in with pt for assessment

## 2019-05-07 NOTE — Consult Note (Signed)
Stroke Neurology Consult [place] at [facility]  Chief Complaint: confusion Referring Provider: Nicholes Mango, MD  HPI: Mr. Roberto Adams is a 84 y.o.  male with a history significant for dementia, prior CVA/TIAs on ASA and Plavix, CAD who present with increased confusion at home. History provided by patient's wife (via phone) and supplemented by review of EMR. Patient is aphasic and/or hard of hearing as well to provide any history.   Last known normal was the night around 7:30 PM prior to presentation. Patient reportedly woke up really early this morning. It's not clear what time he woke up. Reportedly, his wife states that their son woke up and spoke with him before he went to work. His son mentioned that he was concerned the patient had a "stroke" because he "was not making sense." His wife woke up later and saw the patient sitting outside so she didn't think too much of it. A few hours had passed, so the wife went out to check on the patient around 10:30 AM when she noted that he was slurring his words and not making any sense. She said this was the same thing that happened to him when he had his prior stroke.   At baseline, patient is able to take care of himself. He is hard of hearing but still able to communicate with family. He takes care of the bills; although his wife has been going after him to "fix his mistakes." She said he take takes care of his medications and cooks/clean himself. She has noticed that he has slowed down significantly this past year. He walks with a cane/walker. He does not exercise and is usually sedentary. As far as she knows, the patient has been compliant with his ASA and Plavix.   In reviewing his prior documentation from his neurologist Dr Manuella Ghazi - "Patient went to [facility] Emergency Room on 08/08/15 with sudden onset difficulty speaking and some confusion. He had trouble getting his words our and his speech was slurred. He could not  remember his daughters name. Earlier that day he had some concerning skin areas removed and afterwards reports feeling weak. Denied any numbness or weakness. Symptoms lasted roughly an hour. While at the hospital they did a stroke work up including MRI Brain, CT head, ECHO, and carotid ultrasound. He was already taking aspirin before event. Hospital started him on Plavix and atorvastatin. Patient states he stopped taking atorvastatin because it made him nervous and unable to sleep. He has not history of stroke in the past."    His last neurology note was 01/30/2016 - at that point, it was noted that the patient had memory impairment and was diagnosed with mixed dementia. No medications were started at that time.   Patient had an EEG on 12/11/2015: "This is an abnormal Video attended EEG study lasting 3 hr 15 min due to presence of right posterior temporal spike type epileptiform discharge was noted."  Past Medical History:  Diagnosis Date  . Amnesia, global, transient 08/20/2015   Overview:  Better at [facility], mri negative. On lipitor and plavix. Only issue is insomnia now.    . Arthritis   . Benign prostatic hyperplasia with urinary obstruction 11/06/2012  . CAD (coronary artery disease)   . Cancer of right lung ([place])    Right Upper lobe resection.   . Cancer of skin, squamous cell   . Elevated prostate specific antigen (PSA) 07/05/2012  . Enlarged prostate   . Hearing aid worn  both ears  . History of lung cancer 2001   Rt upper lobe; s/p chemo and radiation with recurrence  . HLD (hyperlipidemia)   . Hypertension   . Lumbar canal stenosis 01/17/2013  . Mild cognitive disorder 07/22/2014   Last Assessment & Plan:  Memory seems to be stable   . Nocturia associated with benign prostatic hypertrophy   . Pure hypercholesterolemia 07/22/2014   Last Assessment & Plan:  Has been working on a low fat diet and no myalgia's are present.    Marland Kitchen Spinal stenosis, lumbar region, with neurogenic  claudication 01/17/2013  . TIA (transient ischemic attack) 08/08/2015  . Vasomotor rhinitis 08/08/2014    Past Surgical History:  Procedure Laterality Date  . EPIDURAL BLOCK INJECTION    . LUMBAR LAMINECTOMY/DECOMPRESSION MICRODISCECTOMY Right 01/17/2013   Procedure: Right Lumbar Two-three,Lumbar three-four,Lumbar Four-Five Decompressive Lumbar laminectomy;  Surgeon: Charlie Pitter, MD;  Location: [place] [facility];  Service: Neurosurgery;  Laterality: Right;  right  . LUNG CANCER SURGERY Right 2001  . PILONIDAL CYST / SINUS EXCISION  1958  . ROTATOR CUFF REPAIR  [id number]   Allergies  Allergen Reactions  . Statins Other (See Comments)    Other reaction(s): Muscle Pain Other reaction(s): Muscle Pain   Prior to Admission medications   Medication Sig Start Date End Date Taking? Authorizing Provider  aspirin 81 MG tablet Take 81 mg by mouth daily.   Yes [provider]  clopidogrel (PLAVIX) 75 MG tablet Take 1 tablet (75 mg total) by mouth daily. 08/09/15  Yes Gladstone Lighter, MD  finasteride (PROSCAR) 5 MG tablet Take 1 tablet by mouth once daily 04/18/19  Yes McGowan, Larene Beach A, PA-C  lisinopril-hydrochlorothiazide (PRINZIDE,ZESTORETIC) 20-25 MG tablet Take 1 tablet by mouth daily.  09/03/15  Yes [provider]  tamsulosin (FLOMAX) 0.4 MG CAPS capsule Take 1 capsule (0.4 mg total) by mouth daily. 04/20/19  Yes McGowan, Larene Beach A, PA-C  vitamin B-12 (CYANOCOBALAMIN) 1000 MCG tablet Take 1,000 mcg by mouth daily.   Yes [provider]   Family History  Problem Relation Age of Onset  . Alzheimer's disease Mother   . Alcohol abuse Father   . Heart disease Father   . Prostate cancer Brother   . Kidney disease Neg Hx   . Kidney cancer Neg Hx   . Bladder Cancer Neg Hx    Social History   Socioeconomic History  . Marital status: Married    Spouse name: Not on file  . Number of children: Not on file  . Years of education: Not on file  . Highest education level: Not  on file  Occupational History  . Not on file  Social Needs  . Financial resource strain: Not on file  . Food insecurity    Worry: Not on file    Inability: Not on file  . Transportation needs    Medical: Not on file    Non-medical: Not on file  Tobacco Use  . Smoking status: Former Smoker    Packs/day: 1.00    Years: 35.00    Pack years: 35.00    Types: Cigarettes  . Smokeless tobacco: Never Used  . Tobacco comment: quit smoking 1992  Substance and Sexual Activity  . Alcohol use: No  . Drug use: No  . Sexual activity: Not on file  Lifestyle  . Physical activity    Days per week: Not on file    Minutes per session: Not on file  . Stress: Not on file  Relationships  . Social Herbalist on phone: Not on file    Gets together: Not on file    Attends religious service: Not on file    Active member of club or organization: Not on file    Attends meetings of clubs or organizations: Not on file    Relationship status: Not on file  . Intimate partner violence    Fear of current or ex partner: Not on file    Emotionally abused: Not on file    Physically abused: Not on file    Forced sexual activity: Not on file  Other Topics Concern  . Not on file  Social History Narrative  . Not on file      Continuous Infusions: Scheduled Meds:  . potassium chloride  40 mEq Oral Once   PRN Meds:    Review of Systems Review of systems not obtained due to patient factors.   Objective: Temp:  [98.3 F (36.8 C)] 98.3 F (36.8 C) (07/25 1150) Pulse Rate:  [94-96] 94 (07/25 1230) Resp:  [19-20] 19 (07/25 1200) BP: (151-169)/(77-100) 165/89 (07/25 1400) SpO2:  [91 %-98 %] 91 % (07/25 1230) Weight:  [85.5 kg] 85.5 kg (07/25 1152) No intake or output data in the 24 hours ending 05/07/19 1439  Wt Readings from Last 3 Encounters:  05/07/19 85.5 kg  03/10/18 91.1 kg  03/10/17 91.3 kg      Physical Exam:  GENERAL:  No acute distress.  EYES:   Pupils: pupils equally  round, reactive to light.  ENT:   Throat: oropharynx clear.  CARDIOVASCULAR: regular rate RESPIRATORY: normal work of breathing GASTROINTESTINAL:  Soft, non-tender SKIN:   Inspection: well perfused, no edema.   MENTAL STATUS EXAM:    Orientation: Alert. Patient is not oriented to himself or where he is or to time (questions were written on paper to see if patient could answer but he replies with "I don't know" to every question  Memory: Cooperative. Follows basic one-step commands Language: Speech is dysarthric and language/comprehension is impaired with written language at least. Unable to identify any objects   CRANIAL NERVES:    CN 2 (Optic): Visual fields intact to confrontation    CN 3,4,6 (EOM): Pupils equal and reactive to light. Full extraocular eye movement without nystagmus.  CN 5 (Trigeminal): Facial sensation is normal, no weakness of masticatory muscles.  CN 7 (Facial): No facial weakness or asymmetry.  CN 8 (Auditory): Auditory acuity grossly impaired.  CN 9,10 (Glossophar): Does not open mouth so cannot examine uvula  CN 11 (spinal access): Normal sternocleidomastoid and trapezius strength.  CN 12 (Hypoglossal): Does not protrude tongue.   MOTOR:  Muscle Strength: Strength - At least anti-gravity in all four extremities. Muscle Tone: Tone and muscle bulk are normal in the upper and lower extremities.   REFLEXES:   DTRs - 2+ and symmetrical in all four extremities, plantar responses are flexor bilaterally.   COORDINATION: Unable to do finger-to-nose or heel-to-shin.   SENSATION: Withdraws to mild painful stimuli in all four.   GAIT: Deferred  Level of Consciousness (1a.)   : Alert, keenly responsive (07/25 1530) LOC Questions (1b. )   +: Answers neither question correctly (07/25 1530) LOC Commands (1c. )   + : Performs one task correctly (07/25 1530) Best Gaze (2. )  +: Normal (07/25 1530) Visual (3. )  +: No visual loss (07/25 1530) Facial Palsy (4. )    : Normal  symmetrical movements (07/25  1530) Motor Arm, Left (5a. )   +: No drift (07/25 1530) Motor Arm, Right (5b. )   +: No drift (07/25 1530) Motor Leg, Left (6a. )   +: No drift (07/25 1530) Motor Leg, Right (6b. )   +: No drift (07/25 1530) Limb Ataxia (7. ): Absent (07/25 1530) Sensory (8. )   +: Normal, no sensory loss (07/25 1530) Best Language (9. )   +: Severe aphasia (07/25 1530) Dysarthria (10. ): Severe dysarthria, patient's speech is so slurred as to be unintelligible in the absence of or out of proportion to any dysphasia, or is mute/anarthric (07/25 1530) Extinction/Inattention (11.)   +: No Abnormality (07/25 1530)  Complete NIHSS TOTAL: 7 (07/25 1530) performed by Laurene Footman, MD (neurology)  Labs (I have reviewed the labs below) Recent Labs  Lab 05/07/19 1159  WBC 7.5  HGB 12.8*  HCT 37.9*  PLT 181  MCV 84.0   Recent Labs  Lab 05/07/19 1159  NA 139  K 3.3*  CL 102  CO2 26  BUN 16  CREATININE 0.97  CALCIUM 9.4      Diagnostic Studies: (I have independently reviewed the images and agree with the report)    Assessment: Mr. Roberto Adams is a 84 y.o.  male with the above history who presented with confusion, possibly mixed aphasia. Exam is limited by hearing impairment but even with written language patient does not comprehend. He is moving all four extremities. He reportedly had a similar episode in the past and it was diagnosed as a TIA. He was started on DAPT, which he still continues to this day. Unclear what these episodes are. Patient did have an EEG in the past that did show right posterior temporal spike type epileptiform discharge. Patient is not on any AED. Patient is not a tPA candidate since he is well outside of the treatment window. Unlikely to be a proximal LVO given mild symptoms (no weakness). Differential is broad but would include ischemic stroke vs. epileptic event (especially given findings on EEG in the past)   Principal Problem:   Stroke-like  episode ([place])  Present on Admission: . Stroke-like episode ([place])  Recommendations: - MRI brain without contrast (neurology flair protocol) - Telemetry with q4hr neurochecks and vitals - TTE with bubble study to evaluate cardiac sources - Agree with Carotid ultrasound. Will add CTA head to evaluate intracranial vasculature  - Risk stratification labs with TSH, HbA1c, Fasting Lipids - Continue with patient's home ASA 81 mg and Plavix 75 mg daily and home statin  - May need to consider repeating outpatient ambulatory EEG if the above tests are unremarkable again - Neurology will follow up in morning               It's been a pleasure participating in this patient's care. Please don't hesitate to call with any further questions or concerns.   Laurene Footman, MD Neurology

## 2019-05-07 NOTE — ED Notes (Signed)
Pt back from ct - pt having a hard time articulating what he wants - states "I want" but unable to say what.  - finally figured out he wanted an emesis bag which he spit into. His lips are dry.

## 2019-05-07 NOTE — ED Notes (Signed)
Dr spoke with wife - per wife he was completely normal yesterday with hx of stroke. nihss added. Pt is extremely hard of hearing so limits understanding of his nihss assessment.

## 2019-05-07 NOTE — H&P (Signed)
[facility] NAME: Roberto Adams    MR#:  [id number]  DATE OF BIRTH:  1935/02/07  DATE OF ADMISSION:  05/07/2019  PRIMARY CARE PHYSICIAN: Kirk Ruths, MD   REQUESTING/REFERRING PHYSICIAN: Quentin Cornwall MD  CHIEF COMPLAINT:   Worsening of confusion HISTORY OF PRESENT ILLNESS:  Roberto Adams  is a 84 y.o. male with a known history of TIA, history of dementia with occasional agitation, coronary artery disease, cancer of the right lung status post right upper lobe resection, hypertension, benign prostatic hypertrophy sees urology as an outpatient was found on the driveway in front of the home by his wife and patient seems to be very confused than his baseline.  Patient was in his usual state of health until yesterday as reported by the wife to ER staff.  Patient is brought into the ED there is a concern for TIA by as patient was having some slurry speech and finding difficulty with words.  Patient is also hard of hearing.  Initial CT head and CT of the cervical spine were negative with no acute findings.  Hospitalist team is called to admit the patient.  Patient sees Dr. Cleatrice Burke with a neurologist as an outpatient.  During my examination patient is following verbal commands but main challenge is his hearing problem.  He has passed bedside swallow evaluation as reported by the ER nurse  PAST MEDICAL HISTORY:   Past Medical History:  Diagnosis Date  . Amnesia, global, transient 08/20/2015   Overview:  Better at [facility], mri negative. On lipitor and plavix. Only issue is insomnia now.    . Arthritis   . Benign prostatic hyperplasia with urinary obstruction 11/06/2012  . CAD (coronary artery disease)   . Cancer of right lung ([place])    Right Upper lobe resection.   . Cancer of skin, squamous cell   . Elevated prostate specific antigen (PSA) 07/05/2012  . Enlarged prostate   . Hearing aid worn    both ears  . History of lung cancer  2001   Rt upper lobe; s/p chemo and radiation with recurrence  . HLD (hyperlipidemia)   . Hypertension   . Lumbar canal stenosis 01/17/2013  . Mild cognitive disorder 07/22/2014   Last Assessment & Plan:  Memory seems to be stable   . Nocturia associated with benign prostatic hypertrophy   . Pure hypercholesterolemia 07/22/2014   Last Assessment & Plan:  Has been working on a low fat diet and no myalgia's are present.    Marland Kitchen Spinal stenosis, lumbar region, with neurogenic claudication 01/17/2013  . TIA (transient ischemic attack) 08/08/2015  . Vasomotor rhinitis 08/08/2014    PAST SURGICAL HISTOIRY:   Past Surgical History:  Procedure Laterality Date  . EPIDURAL BLOCK INJECTION    . LUMBAR LAMINECTOMY/DECOMPRESSION MICRODISCECTOMY Right 01/17/2013   Procedure: Right Lumbar Two-three,Lumbar three-four,Lumbar Four-Five Decompressive Lumbar laminectomy;  Surgeon: Charlie Pitter, MD;  Location: [facility];  Service: Neurosurgery;  Laterality: Right;  right  . LUNG CANCER SURGERY Right 2001  . PILONIDAL CYST / SINUS EXCISION  1958  . ROTATOR CUFF REPAIR  [phone]    SOCIAL HISTORY:   Social History   Tobacco Use  . Smoking status: Former Smoker    Packs/day: 1.00    Years: 35.00    Pack years: 35.00    Types: Cigarettes  . Smokeless tobacco: Never Used  . Tobacco comment: quit smoking 1992  Substance Use Topics  .  Alcohol use: No    FAMILY HISTORY:   Family History  Problem Relation Age of Onset  . Alzheimer's disease Mother   . Alcohol abuse Father   . Heart disease Father   . Prostate cancer Brother   . Kidney disease Neg Hx   . Kidney cancer Neg Hx   . Bladder Cancer Neg Hx     DRUG ALLERGIES:   Allergies  Allergen Reactions  . Statins Other (See Comments)    Other reaction(s): Muscle Pain Other reaction(s): Muscle Pain    REVIEW OF SYSTEMS:   Limited review of system as patient is very hard of hearing with some underlying dementia   CONSTITUTIONAL: No  fever, fatigue or weakness.  EYES: No blurred or double vision.  RESPIRATORY: No cough, shortness of breath, wheezing or hemoptysis.  CARDIOVASCULAR: No chest pain, orthopnea, edema.  MUSCULOSKELETAL: No joint pain or arthritis.   NEUROLOGIC: No tingling, numbness, weakness.   MEDICATIONS AT HOME:   Prior to Admission medications   Medication Sig Start Date End Date Taking? Authorizing Provider  aspirin 81 MG tablet Take 81 mg by mouth daily.   Yes [provider]  clopidogrel (PLAVIX) 75 MG tablet Take 1 tablet (75 mg total) by mouth daily. 08/09/15  Yes Gladstone Lighter, MD  finasteride (PROSCAR) 5 MG tablet Take 1 tablet by mouth once daily 04/18/19  Yes McGowan, Larene Beach A, PA-C  lisinopril-hydrochlorothiazide (PRINZIDE,ZESTORETIC) 20-25 MG tablet Take 1 tablet by mouth daily.  09/03/15  Yes [provider]  tamsulosin (FLOMAX) 0.4 MG CAPS capsule Take 1 capsule (0.4 mg total) by mouth daily. 04/20/19  Yes McGowan, Larene Beach A, PA-C  vitamin B-12 (CYANOCOBALAMIN) 1000 MCG tablet Take 1,000 mcg by mouth daily.   Yes [provider]      VITAL SIGNS:  Blood pressure (!) 165/89, pulse 94, temperature 98.3 F (36.8 C), resp. rate 19, weight 85.5 kg, SpO2 91 %.  PHYSICAL EXAMINATION:  GENERAL:  84 y.o.-year-old patient lying in the bed with no acute distress.  EYES: Pupils equal, round, reactive to light and accommodation. No scleral icterus. Extraocular muscles intact.  HEENT: Head atraumatic, normocephalic. Oropharynx and nasopharynx clear.  NECK:  Supple, no jugular venous distention. No thyroid enlargement, no tenderness.  LUNGS: Normal breath sounds bilaterally, no wheezing, rales,rhonchi or crepitation. No use of accessory muscles of respiration.  CARDIOVASCULAR: S1, S2 normal. No murmurs, rubs, or gallops.  ABDOMEN: Soft, nontender, nondistended. Bowel sounds present.  EXTREMITIES: No pedal edema, cyanosis, or clubbing.  NEUROLOGIC: Cranial nerves II  through XII are intact. Muscle strength global weakness in all extremities. Sensation intact. Gait not checked.  Hard of hearing and able to follow verbal commands if he understands, with staff wearing masks patient is having hard time to read the lips PSYCHIATRIC: The patient is alert and oriented x 2-3.  SKIN: No obvious rash, lesion, or ulcer.   LABORATORY PANEL:   CBC Recent Labs  Lab 05/07/19 1159  WBC 7.5  HGB 12.8*  HCT 37.9*  PLT 181   ------------------------------------------------------------------------------------------------------------------  Chemistries  Recent Labs  Lab 05/07/19 1159  NA 139  K 3.3*  CL 102  CO2 26  GLUCOSE 101*  BUN 16  CREATININE 0.97  CALCIUM 9.4  AST 26  ALT 11  ALKPHOS 87  BILITOT 1.1   ------------------------------------------------------------------------------------------------------------------  Cardiac Enzymes No results for input(s): TROPONINI in the last 168 hours. ------------------------------------------------------------------------------------------------------------------  RADIOLOGY:  Ct Head Wo Contrast  Result Date: 05/07/2019 CLINICAL DATA:  84 year old male  with altered mental status and possible C-spine injury. EXAM: CT HEAD WITHOUT CONTRAST CT CERVICAL SPINE WITHOUT CONTRAST TECHNIQUE: Multidetector CT imaging of the head and cervical spine was performed following the standard protocol without intravenous contrast. Multiplanar CT image reconstructions of the cervical spine were also generated. COMPARISON:  05/30/2016 no prior exams FINDINGS: CT HEAD FINDINGS Brain: No evidence of acute infarction, hemorrhage, hydrocephalus, extra-axial collection or mass lesion/mass effect. Moderate atrophy and chronic small-vessel white matter ischemic changes again noted. Vascular: Carotid and vertebral atherosclerotic calcifications identified. Skull: Normal. Negative for fracture or focal lesion. Sinuses/Orbits: No acute  finding. Other: None. CT CERVICAL SPINE FINDINGS Alignment: Normal. Skull base and vertebrae: No acute fracture. No primary bone lesion or focal pathologic process. Soft tissues and spinal canal: No prevertebral fluid or swelling. No visible canal hematoma. Disc levels: Mild multilevel degenerative disc disease/spondylosis and moderate multilevel facet arthropathy noted contributing to central spinal and foraminal narrowing at several levels. Upper chest: A LEFT pleural effusion is noted and appears moderate. Other: None IMPRESSION: 1. No evidence of acute intracranial abnormality. Moderate atrophy and chronic small-vessel white matter ischemic changes. 2. No static evidence of acute injury to the cervical spine. Moderate multilevel degenerative changes. 3. LEFT pleural effusion, which appears moderate and is not well visualized on recent chest radiograph Electronically Signed   By: Margarette Canada M.D.   On: 05/07/2019 12:48   Ct Cervical Spine Wo Contrast  Result Date: 05/07/2019 CLINICAL DATA:  84 year old male with altered mental status and possible C-spine injury. EXAM: CT HEAD WITHOUT CONTRAST CT CERVICAL SPINE WITHOUT CONTRAST TECHNIQUE: Multidetector CT imaging of the head and cervical spine was performed following the standard protocol without intravenous contrast. Multiplanar CT image reconstructions of the cervical spine were also generated. COMPARISON:  05/30/2016 no prior exams FINDINGS: CT HEAD FINDINGS Brain: No evidence of acute infarction, hemorrhage, hydrocephalus, extra-axial collection or mass lesion/mass effect. Moderate atrophy and chronic small-vessel white matter ischemic changes again noted. Vascular: Carotid and vertebral atherosclerotic calcifications identified. Skull: Normal. Negative for fracture or focal lesion. Sinuses/Orbits: No acute finding. Other: None. CT CERVICAL SPINE FINDINGS Alignment: Normal. Skull base and vertebrae: No acute fracture. No primary bone lesion or focal  pathologic process. Soft tissues and spinal canal: No prevertebral fluid or swelling. No visible canal hematoma. Disc levels: Mild multilevel degenerative disc disease/spondylosis and moderate multilevel facet arthropathy noted contributing to central spinal and foraminal narrowing at several levels. Upper chest: A LEFT pleural effusion is noted and appears moderate. Other: None IMPRESSION: 1. No evidence of acute intracranial abnormality. Moderate atrophy and chronic small-vessel white matter ischemic changes. 2. No static evidence of acute injury to the cervical spine. Moderate multilevel degenerative changes. 3. LEFT pleural effusion, which appears moderate and is not well visualized on recent chest radiograph Electronically Signed   By: Margarette Canada M.D.   On: 05/07/2019 12:48   Dg Chest Portable 1 View  Result Date: 05/07/2019 CLINICAL DATA:  Confusion and dementia. EXAM: PORTABLE CHEST 1 VIEW COMPARISON:  05/30/2016 FINDINGS: The cardiomediastinal silhouette is unremarkable. New interstitial and possible airspace opacities within the LEFT lung noted. No definite pleural effusion or pneumothorax. No acute bony abnormality noted. IMPRESSION: New LEFT lung interstitial and possible airspace opacities. This may represent infection or asymmetric edema. Electronically Signed   By: Margarette Canada M.D.   On: 05/07/2019 12:31    EKG:   Orders placed or performed during the hospital encounter of 05/07/19  . EKG 12-Lead  . EKG 12-Lead  IMPRESSION AND PLAN:   TIA with past h/o TIA  Admit to MedSurg unit under observation status CT head and CT cervical spine are negative Complete stroke work-up with MRI of the brain, carotid Dopplers and 2D echocardiogram Neurology consult placed and notify Dr. Talbert Cage vu, he is aware of the consult Patient has passed bedside swallow evaluation TPA is not considered as the patient is out of window by the time he arrived ED and CT head is negative PT, OT evaluation Check  TSH, fasting lipid panel and A1c Continue home medication baby aspirin and Plavix 75 mg once daily  Lt mod plueral effusion  We will give 1 dose of Lasix patient is denying any shortness of breath and repeat chest x-ray in a.m.   HTN Continue home medication lisinopril hydrochlorothiazide and titrate as needed   BPH with h/o hematuria Op f/u with urology as recommended  Continue home medication Flomax and Proscar  History of lung cancer status post right upper lobe resection  History of dementia with intermittent episodes of aggressive behavior Outpatient follow-up with the primary neurologist Dr. Cleatrice Burke  Hypokalemia replete and recheck in a.m.   DVT prophylaxis with Lovenox subcu All the records are reviewed and case discussed with ED provider. Management plans discussed with the patient, wife over ph  and they are in agreement.  CODE STATUS: fc   TOTAL TIME TAKING CARE OF THIS PATIENT: 45 minutes.   Note: This dictation was prepared with Dragon dictation along with smaller phrase technology. Any transcriptional errors that result from this process are unintentional.  Nicholes Mango M.D on 05/07/2019 at 2:39 PM  Between 7am to 6pm - Pager - [phone]  After 6pm go to [email or web address] - password [email or web address] [facility]  [facility]  Office  [phone]  CC: Primary care physician; Kirk Ruths, MD

## 2019-05-07 NOTE — ED Provider Notes (Signed)
[facility] Emergency Department Provider Note    First MD Initiated Contact with Patient 05/07/19 1146     (approximate)  I have reviewed the triage vital signs and the nursing notes.   HISTORY  Chief Complaint Altered Mental Status  Level V Caveat:  AMS  HPI Roberto Adams is a 84 y.o. Adams the below listed past medical history with history of CVA on aspirin Plavix as well as recent diagnosis of dementia but typically communicative and highly functioning at baseline presents from home via EMS due to altered mental status.  Wife called EMS after noting the patient was found this morning sitting out in the driveway.  She went to check on him roughly an hour or 2 later and he was with slurred speech and was not making any sense.  Last time she saw him at his baseline was last night.  No report of any falls.  States that he had similar presentation during his previous stroke.  No known sick contacts.  Patient pleasant and cooperative but does appear appear confused.  Denies any pain.    Past Medical History:  Diagnosis Date   Amnesia, global, transient 08/20/2015   Overview:  Better at [facility], mri negative. On lipitor and plavix. Only issue is insomnia now.     Arthritis    Benign prostatic hyperplasia with urinary obstruction 11/06/2012   CAD (coronary artery disease)    Cancer of right lung (HCC)    Right Upper lobe resection.    Cancer of skin, squamous cell    Elevated prostate specific antigen (PSA) 07/05/2012   Enlarged prostate    Hearing aid worn    both ears   History of lung cancer 2001   Rt upper lobe; s/p chemo and radiation with recurrence   HLD (hyperlipidemia)    Hypertension    Lumbar canal stenosis 01/17/2013   Mild cognitive disorder 07/22/2014   Last Assessment & Plan:  Memory seems to be stable    Nocturia associated with benign prostatic hypertrophy    Pure hypercholesterolemia 07/22/2014   Last Assessment & Plan:  Has  been working on a low fat diet and no myalgia's are present.     Spinal stenosis, lumbar region, with neurogenic claudication 01/17/2013   TIA (transient ischemic attack) 08/08/2015   Vasomotor rhinitis 08/08/2014   Family History  Problem Relation Age of Onset   Alzheimer's disease Mother    Alcohol abuse Father    Heart disease Father    Prostate cancer Brother    Kidney disease Neg Hx    Kidney cancer Neg Hx    Bladder Cancer Neg Hx    Past Surgical History:  Procedure Laterality Date   EPIDURAL BLOCK INJECTION     LUMBAR LAMINECTOMY/DECOMPRESSION MICRODISCECTOMY Right 01/17/2013   Procedure: Right Lumbar Two-three,Lumbar three-four,Lumbar Four-Five Decompressive Lumbar laminectomy;  Surgeon: Charlie Pitter, MD;  Location: [facility];  Service: Neurosurgery;  Laterality: Right;  right   LUNG CANCER SURGERY Right 2001   PILONIDAL CYST / SINUS EXCISION  1958   ROTATOR CUFF REPAIR  1601.0932   Patient Active Problem List   Diagnosis Date Noted   Gross hematuria 12/07/2015   Amnesia, global, transient 08/20/2015   TIA (transient ischemic attack) 08/08/2015   HTN (hypertension) 08/08/2015   CAD (coronary artery disease) 08/08/2015   HLD (hyperlipidemia) 08/08/2015   Dietary vitamin B12 deficiency anemia 08/08/2014   Vasomotor rhinitis 08/08/2014   Mild cognitive disorder 07/22/2014  Essential (primary) hypertension 07/22/2014   H/O malignant neoplasm 07/22/2014   Pure hypercholesterolemia 07/22/2014   Spinal stenosis, lumbar region, with neurogenic claudication 01/17/2013   Lumbar canal stenosis 01/17/2013   Benign prostatic hyperplasia with urinary obstruction 11/06/2012   Elevated prostate specific antigen (PSA) 07/05/2012      Prior to Admission medications   Medication Sig Start Date End Date Taking? Authorizing Provider  aspirin 81 MG tablet Take 81 mg by mouth daily.   Yes [provider]  clopidogrel (PLAVIX) 75 MG tablet Take  1 tablet (75 mg total) by mouth daily. 08/09/15  Yes Gladstone Lighter, MD  finasteride (PROSCAR) 5 MG tablet Take 1 tablet by mouth once daily 04/18/19  Yes McGowan, Larene Beach A, PA-C  lisinopril-hydrochlorothiazide (PRINZIDE,ZESTORETIC) 20-25 MG tablet Take 1 tablet by mouth daily.  09/03/15  Yes [provider]  tamsulosin (FLOMAX) 0.4 MG CAPS capsule Take 1 capsule (0.4 mg total) by mouth daily. 04/20/19  Yes McGowan, Larene Beach A, PA-C  vitamin B-12 (CYANOCOBALAMIN) 1000 MCG tablet Take 1,000 mcg by mouth daily.   Yes [provider]    Allergies Statins    Social History Social History   Tobacco Use   Smoking status: Former Smoker    Packs/day: 1.00    Years: 35.00    Pack years: 35.00    Types: Cigarettes   Smokeless tobacco: Never Used   Tobacco comment: quit smoking 1992  Substance Use Topics   Alcohol use: No   Drug use: No    Review of Systems Patient denies headaches, rhinorrhea, blurry vision, numbness, shortness of breath, chest pain, edema, cough, abdominal pain, nausea, vomiting, diarrhea, dysuria, fevers, rashes or hallucinations unless otherwise stated above in HPI. ____________________________________________   PHYSICAL EXAM:  VITAL SIGNS: Vitals:   05/07/19 1400 05/07/19 1430  BP: (!) 165/89 (!) 158/78  Pulse:  98  Resp:  18  Temp:    SpO2:  93%    Constitutional: Alert, but confused to person place and time.  pleasant Eyes: Conjunctivae are normal.  Head: Atraumatic. Nose: No congestion/rhinnorhea. Mouth/Throat: Mucous membranes are moist.   Neck: No stridor. Painless ROM.  Cardiovascular: Normal rate, regular rhythm. Grossly normal heart sounds.  Good peripheral circulation. Respiratory: Normal respiratory effort.  No retractions. Lungs CTAB. Gastrointestinal: Soft and nontender. No distention. No abdominal bruits. No CVA tenderness. Genitourinary: deferred Musculoskeletal: No lower extremity tenderness nor edema.  No joint  effusions. Neurologic:  No facial droop, MAE spontaneously, no drift.  Difficulty following commands for two step process, FNF. Skin:  Skin is warm, dry and intact. No rash noted. Psychiatric: no agitation, calm and cooperative  ____________________________________________   LABS (all labs ordered are listed, but only abnormal results are displayed)  Results for orders placed or performed during the hospital encounter of 05/07/19 (from the past 24 hour(s))  CBC with Differential/Platelet     Status: Abnormal   Collection Time: 05/07/19 11:59 AM  Result Value Ref Range   WBC 7.5 4.0 - 10.5 K/uL   RBC 4.51 4.22 - 5.81 MIL/uL   Hemoglobin 12.8 (L) 13.0 - 17.0 g/dL   HCT 37.9 (L) 39.0 - 52.0 %   MCV 84.0 80.0 - 100.0 fL   MCH 28.4 26.0 - 34.0 pg   MCHC 33.8 30.0 - 36.0 g/dL   RDW 14.2 11.5 - 15.5 %   Platelets 181 150 - 400 K/uL   nRBC 0.0 0.0 - 0.2 %   Neutrophils Relative % 71 %   Neutro Abs  5.4 1.7 - 7.7 K/uL   Lymphocytes Relative 14 %   Lymphs Abs 1.0 0.7 - 4.0 K/uL   Monocytes Relative 13 %   Monocytes Absolute 1.0 0.1 - 1.0 K/uL   Eosinophils Relative 1 %   Eosinophils Absolute 0.0 0.0 - 0.5 K/uL   Basophils Relative 0 %   Basophils Absolute 0.0 0.0 - 0.1 K/uL   Immature Granulocytes 1 %   Abs Immature Granulocytes 0.06 0.00 - 0.07 K/uL  Comprehensive metabolic panel     Status: Abnormal   Collection Time: 05/07/19 11:59 AM  Result Value Ref Range   Sodium 139 135 - 145 mmol/L   Potassium 3.3 (L) 3.5 - 5.1 mmol/L   Chloride 102 98 - 111 mmol/L   CO2 26 22 - 32 mmol/L   Glucose, Bld 101 (H) 70 - 99 mg/dL   BUN 16 8 - 23 mg/dL   Creatinine, Ser 0.97 0.61 - 1.24 mg/dL   Calcium 9.4 8.9 - 10.3 mg/dL   Total Protein 7.9 6.5 - 8.1 g/dL   Albumin 3.9 3.5 - 5.0 g/dL   AST 26 15 - 41 U/L   ALT 11 0 - 44 U/L   Alkaline Phosphatase 87 38 - 126 U/L   Total Bilirubin 1.1 0.3 - 1.2 mg/dL   GFR calc non Af Amer >60 >60 mL/min   GFR calc Af Amer >60 >60 mL/min   Anion gap 11  5 - 15  Urinalysis, Complete w Microscopic     Status: Abnormal   Collection Time: 05/07/19 11:59 AM  Result Value Ref Range   Color, Urine YELLOW (A) YELLOW   APPearance CLOUDY (A) CLEAR   Specific Gravity, Urine 1.018 1.005 - 1.030   pH 6.0 5.0 - 8.0   Glucose, UA NEGATIVE NEGATIVE mg/dL   Hgb urine dipstick NEGATIVE NEGATIVE   Bilirubin Urine NEGATIVE NEGATIVE   Ketones, ur NEGATIVE NEGATIVE mg/dL   Protein, ur NEGATIVE NEGATIVE mg/dL   Nitrite POSITIVE (A) NEGATIVE   Leukocytes,Ua MODERATE (A) NEGATIVE   RBC / HPF 0-5 0 - 5 RBC/hpf   WBC, UA >50 (H) 0 - 5 WBC/hpf   Bacteria, UA MANY (A) NONE SEEN   Squamous Epithelial / LPF NONE SEEN 0 - 5   Ca Oxalate Crys, UA PRESENT   SARS Coronavirus 2 (CEPHEID - Performed in [place] hospital lab), Hosp Order     Status: None   Collection Time: 05/07/19 12:40 PM   Specimen: Nasopharyngeal Swab  Result Value Ref Range   SARS Coronavirus 2 NEGATIVE NEGATIVE   ____________________________________________  EKG My review and personal interpretation at Time: 11:48   Indication: ams  Rate: 100  Rhythm: sinus Axis: normal Other: normal intervals, no stemi ____________________________________________  RADIOLOGY  I personally reviewed all radiographic images ordered to evaluate for the above acute complaints and reviewed radiology reports and findings.  These findings were personally discussed with the patient.  Please see medical record for radiology report.  ____________________________________________   PROCEDURES  Procedure(s) performed:  Procedures    Critical Care performed: no ____________________________________________   INITIAL IMPRESSION / ASSESSMENT AND PLAN / ED COURSE  Pertinent labs & imaging results that were available during my care of the patient were reviewed by me and considered in my medical decision making (see chart for details).   DDX: cva, tia, hypoglycemia, dehydration, electrolyte abnormality,  dissection, sepsis   Roberto Adams is a 84 y.o. who presents to the ED with was as described above.  Patient is out of the window for code stroke.  Blood work and CT imaging ordered for the by differential.  No evidence of hemorrhage.  Patient with reporting symptoms concerning for TIA.  Possible underlying metabolic process will do feel patient will require hospitalization for further medical work-up.     The patient was evaluated in Emergency Department today for the symptoms described in the history of present illness. He/she was evaluated in the context of the global COVID-19 pandemic, which necessitated consideration that the patient might be at risk for infection with the SARS-CoV-2 virus that causes COVID-19. Institutional protocols and algorithms that pertain to the evaluation of patients at risk for COVID-19 are in a state of rapid change based on information released by regulatory bodies including the CDC and federal and state organizations. These policies and algorithms were followed during the patient's care in the ED.  As part of my medical decision making, I reviewed the following data within the [place] notes reviewed and incorporated, Labs reviewed, notes from prior ED visits and [place] Controlled Substance Database   ____________________________________________   FINAL CLINICAL IMPRESSION(S) / ED DIAGNOSES  Final diagnoses:  Altered mental status, unspecified altered mental status type      NEW MEDICATIONS STARTED DURING THIS VISIT:  New Prescriptions   No medications on file     Note:  This document was prepared using Dragon voice recognition software and may include unintentional dictation errors.    Merlyn Lot, MD 05/07/19 [phone]

## 2019-05-07 NOTE — Progress Notes (Signed)
Family Meeting Note  Advance Directive:yes  Today a meeting took place with the Patient.     The following clinical team members were present during this meeting:MD  The following were discussed:Patient's diagnosis: TIA, coronary artery disease, hypertension, hyperlipidemia, history of benign prostatic hypertrophy and dementia , left moderate pleural effusion, will be admitted under observation status.  Plan of care discussed in detail with the patient and his wife is the healthcare power of attorney.  They both verbalized understanding of the plan.  Patient is hard of hearing   patient's progosis: Unable to determine and Goals for treatment: Full Code  Wife is the healthcare power of attorney  Additional follow-up to be provided: Hospitalist, neurologist   Time spent during discussion:17 min   Nicholes Mango, MD

## 2019-05-07 NOTE — ED Notes (Signed)
ED TO INPATIENT HANDOFF REPORT  ED Nurse Name and Phone #: Elishah Ashmore [phone]  S Name/Age/Gender Roberto Adams 84 y.o. male Room/Bed: ED05A/ED05A  Code Status   Code Status: Prior  Home/SNF/Other Home Patient oriented to: self Is this baseline? hx of dementia unsure of baseline  Triage Complete: Triage complete  Chief Complaint confusion  Triage Note Per family pt has increased confusion. Hx dementia with ocassional aggitation worse today. Pt ambulated to ems. Denies pain   Allergies Allergies  Allergen Reactions  . Statins Other (See Comments)    Other reaction(s): Muscle Pain Other reaction(s): Muscle Pain    Level of Care/Admitting Diagnosis ED Disposition    ED Disposition Condition Comment   Admit  Hospital Area: [place] [[id number]]  Level of Care: Med-Surg [16]  Covid Evaluation: Confirmed COVID Negative  Diagnosis: TIA (transient ischemic attack) [229798]  Admitting Physician: Nicholes Mango [[id number]]  Attending Physician: Nicholes Mango [[id number]]  Bed request comments: 1c  PT Class (Do Not Modify): Observation [104]  PT Acc Code (Do Not Modify): Observation [10022]       B Medical/Surgery History Past Medical History:  Diagnosis Date  . Amnesia, global, transient 08/20/2015   Overview:  Better at [facility], mri negative. On lipitor and plavix. Only issue is insomnia now.    . Arthritis   . Benign prostatic hyperplasia with urinary obstruction 11/06/2012  . CAD (coronary artery disease)   . Cancer of right lung ([place])    Right Upper lobe resection.   . Cancer of skin, squamous cell   . Elevated prostate specific antigen (PSA) 07/05/2012  . Enlarged prostate   . Hearing aid worn    both ears  . History of lung cancer 2001   Rt upper lobe; s/p chemo and radiation with recurrence  . HLD (hyperlipidemia)   . Hypertension   . Lumbar canal stenosis 01/17/2013  . Mild cognitive disorder 07/22/2014   Last Assessment & Plan:  Memory seems to be stable    . Nocturia associated with benign prostatic hypertrophy   . Pure hypercholesterolemia 07/22/2014   Last Assessment & Plan:  Has been working on a low fat diet and no myalgia's are present.    Marland Kitchen Spinal stenosis, lumbar region, with neurogenic claudication 01/17/2013  . TIA (transient ischemic attack) 08/08/2015  . Vasomotor rhinitis 08/08/2014   Past Surgical History:  Procedure Laterality Date  . EPIDURAL BLOCK INJECTION    . LUMBAR LAMINECTOMY/DECOMPRESSION MICRODISCECTOMY Right 01/17/2013   Procedure: Right Lumbar Two-three,Lumbar three-four,Lumbar Four-Five Decompressive Lumbar laminectomy;  Surgeon: Charlie Pitter, MD;  Location: [facility];  Service: Neurosurgery;  Laterality: Right;  right  . LUNG CANCER SURGERY Right 2001  . PILONIDAL CYST / SINUS EXCISION  1958  . ROTATOR CUFF REPAIR  [id number]     A IV Location/Drains/Wounds Patient Lines/Drains/Airways Status   Active Line/Drains/Airways    Name:   Placement date:   Placement time:   Site:   Days:   Peripheral IV 05/07/19 Right Forearm   05/07/19    1210    Forearm   less than 1          Intake/Output Last 24 hours No intake or output data in the 24 hours ending 05/07/19 1518  Labs/Imaging Results for orders placed or performed during the hospital encounter of 05/07/19 (from the past 48 hour(s))  CBC with Differential/Platelet     Status: Abnormal   Collection Time: 05/07/19 11:59 AM  Result Value Ref Range  WBC 7.5 4.0 - 10.5 K/uL   RBC 4.51 4.22 - 5.81 MIL/uL   Hemoglobin 12.8 (L) 13.0 - 17.0 g/dL   HCT 37.9 (L) 39.0 - 52.0 %   MCV 84.0 80.0 - 100.0 fL   MCH 28.4 26.0 - 34.0 pg   MCHC 33.8 30.0 - 36.0 g/dL   RDW 14.2 11.5 - 15.5 %   Platelets 181 150 - 400 K/uL   nRBC 0.0 0.0 - 0.2 %   Neutrophils Relative % 71 %   Neutro Abs 5.4 1.7 - 7.7 K/uL   Lymphocytes Relative 14 %   Lymphs Abs 1.0 0.7 - 4.0 K/uL   Monocytes Relative 13 %   Monocytes Absolute 1.0 0.1 - 1.0 K/uL   Eosinophils Relative 1 %    Eosinophils Absolute 0.0 0.0 - 0.5 K/uL   Basophils Relative 0 %   Basophils Absolute 0.0 0.0 - 0.1 K/uL   Immature Granulocytes 1 %   Abs Immature Granulocytes 0.06 0.00 - 0.07 K/uL    Comment: Performed at [facility], [address] [place], [place] [address]  Comprehensive metabolic panel     Status: Abnormal   Collection Time: 05/07/19 11:59 AM  Result Value Ref Range   Sodium 139 135 - 145 mmol/L   Potassium 3.3 (L) 3.5 - 5.1 mmol/L   Chloride 102 98 - 111 mmol/L   CO2 26 22 - 32 mmol/L   Glucose, Bld 101 (H) 70 - 99 mg/dL   BUN 16 8 - 23 mg/dL   Creatinine, Ser 0.97 0.61 - 1.24 mg/dL   Calcium 9.4 8.9 - 10.3 mg/dL   Total Protein 7.9 6.5 - 8.1 g/dL   Albumin 3.9 3.5 - 5.0 g/dL   AST 26 15 - 41 U/L   ALT 11 0 - 44 U/L   Alkaline Phosphatase 87 38 - 126 U/L   Total Bilirubin 1.1 0.3 - 1.2 mg/dL   GFR calc non Af Amer >60 >60 mL/min   GFR calc Af Amer >60 >60 mL/min   Anion gap 11 5 - 15    Comment: Performed at [facility] [address] [place], [place] [address]  Urinalysis, Complete w Microscopic     Status: Abnormal   Collection Time: 05/07/19 11:59 AM  Result Value Ref Range   Color, Urine YELLOW (A) YELLOW   APPearance CLOUDY (A) CLEAR   Specific Gravity, Urine 1.018 1.005 - 1.030   pH 6.0 5.0 - 8.0   Glucose, UA NEGATIVE NEGATIVE mg/dL   Hgb urine dipstick NEGATIVE NEGATIVE   Bilirubin Urine NEGATIVE NEGATIVE   Ketones, ur NEGATIVE NEGATIVE mg/dL   Protein, ur NEGATIVE NEGATIVE mg/dL   Nitrite POSITIVE (A) NEGATIVE   Leukocytes,Ua MODERATE (A) NEGATIVE   RBC / HPF 0-5 0 - 5 RBC/hpf   WBC, UA >50 (H) 0 - 5 WBC/hpf   Bacteria, UA MANY (A) NONE SEEN   Squamous Epithelial / LPF NONE SEEN 0 - 5   Ca Oxalate Crys, UA PRESENT     Comment: Performed at [facility], [address], [place], [place] [address]  SARS Coronavirus 2 (CEPHEID - Performed in [place] hospital lab), Hosp Order     Status: None   Collection Time:  05/07/19 12:40 PM   Specimen: Nasopharyngeal Swab  Result Value Ref Range   SARS Coronavirus 2 NEGATIVE NEGATIVE    Comment: (NOTE) If result is NEGATIVE SARS-CoV-2 target nucleic acids are NOT DETECTED. The SARS-CoV-2 RNA is generally  detectable in upper and lower  respiratory specimens during the acute phase of infection. The lowest  concentration of SARS-CoV-2 viral copies this assay can detect is 250  copies / mL. A negative result does not preclude SARS-CoV-2 infection  and should not be used as the sole basis for treatment or other  patient management decisions.  A negative result may occur with  improper specimen collection / handling, submission of specimen other  than nasopharyngeal swab, presence of viral mutation(s) within the  areas targeted by this assay, and inadequate number of viral copies  (<250 copies / mL). A negative result must be combined with clinical  observations, patient history, and epidemiological information. If result is POSITIVE SARS-CoV-2 target nucleic acids are DETECTED. The SARS-CoV-2 RNA is generally detectable in upper and lower  respiratory specimens dur ing the acute phase of infection.  Positive  results are indicative of active infection with SARS-CoV-2.  Clinical  correlation with patient history and other diagnostic information is  necessary to determine patient infection status.  Positive results do  not rule out bacterial infection or co-infection with other viruses. If result is PRESUMPTIVE POSTIVE SARS-CoV-2 nucleic acids MAY BE PRESENT.   A presumptive positive result was obtained on the submitted specimen  and confirmed on repeat testing.  While 2019 novel coronavirus  (SARS-CoV-2) nucleic acids may be present in the submitted sample  additional confirmatory testing may be necessary for epidemiological  and / or clinical management purposes  to differentiate between  SARS-CoV-2 and other Sarbecovirus currently known to infect humans.   If clinically indicated additional testing with an alternate test  methodology [phone]) is advised. The SARS-CoV-2 RNA is generally  detectable in upper and lower respiratory sp ecimens during the acute  phase of infection. The expected result is Negative. Fact Sheet for Patients:  StrictlyIdeas.no Fact Sheet for Healthcare Providers: BankingDealers.co.za This test is not yet approved or cleared by the [place] FDA and has been authorized for detection and/or diagnosis of SARS-CoV-2 by FDA under an Emergency Use Authorization (EUA).  This EUA will remain in effect (meaning this test can be used) for the duration of the COVID-19 declaration under Section 564(b)(1) of the Act, 21 U.S.C. section 360bbb-3(b)(1), unless the authorization is terminated or revoked sooner. Performed at [facility], [place], [place] [address]    Ct Head Wo Contrast  Result Date: 05/07/2019 CLINICAL DATA:  84 year old male with altered mental status and possible C-spine injury. EXAM: CT HEAD WITHOUT CONTRAST CT CERVICAL SPINE WITHOUT CONTRAST TECHNIQUE: Multidetector CT imaging of the head and cervical spine was performed following the standard protocol without intravenous contrast. Multiplanar CT image reconstructions of the cervical spine were also generated. COMPARISON:  05/30/2016 no prior exams FINDINGS: CT HEAD FINDINGS Brain: No evidence of acute infarction, hemorrhage, hydrocephalus, extra-axial collection or mass lesion/mass effect. Moderate atrophy and chronic small-vessel white matter ischemic changes again noted. Vascular: Carotid and vertebral atherosclerotic calcifications identified. Skull: Normal. Negative for fracture or focal lesion. Sinuses/Orbits: No acute finding. Other: None. CT CERVICAL SPINE FINDINGS Alignment: Normal. Skull base and vertebrae: No acute fracture. No primary bone lesion or focal pathologic process.  Soft tissues and spinal canal: No prevertebral fluid or swelling. No visible canal hematoma. Disc levels: Mild multilevel degenerative disc disease/spondylosis and moderate multilevel facet arthropathy noted contributing to central spinal and foraminal narrowing at several levels. Upper chest: A LEFT pleural effusion is noted and appears moderate. Other: None IMPRESSION: 1. No evidence of acute  intracranial abnormality. Moderate atrophy and chronic small-vessel white matter ischemic changes. 2. No static evidence of acute injury to the cervical spine. Moderate multilevel degenerative changes. 3. LEFT pleural effusion, which appears moderate and is not well visualized on recent chest radiograph Electronically Signed   By: Margarette Canada M.D.   On: 05/07/2019 12:48   Ct Cervical Spine Wo Contrast  Result Date: 05/07/2019 CLINICAL DATA:  84 year old male with altered mental status and possible C-spine injury. EXAM: CT HEAD WITHOUT CONTRAST CT CERVICAL SPINE WITHOUT CONTRAST TECHNIQUE: Multidetector CT imaging of the head and cervical spine was performed following the standard protocol without intravenous contrast. Multiplanar CT image reconstructions of the cervical spine were also generated. COMPARISON:  05/30/2016 no prior exams FINDINGS: CT HEAD FINDINGS Brain: No evidence of acute infarction, hemorrhage, hydrocephalus, extra-axial collection or mass lesion/mass effect. Moderate atrophy and chronic small-vessel white matter ischemic changes again noted. Vascular: Carotid and vertebral atherosclerotic calcifications identified. Skull: Normal. Negative for fracture or focal lesion. Sinuses/Orbits: No acute finding. Other: None. CT CERVICAL SPINE FINDINGS Alignment: Normal. Skull base and vertebrae: No acute fracture. No primary bone lesion or focal pathologic process. Soft tissues and spinal canal: No prevertebral fluid or swelling. No visible canal hematoma. Disc levels: Mild multilevel degenerative disc  disease/spondylosis and moderate multilevel facet arthropathy noted contributing to central spinal and foraminal narrowing at several levels. Upper chest: A LEFT pleural effusion is noted and appears moderate. Other: None IMPRESSION: 1. No evidence of acute intracranial abnormality. Moderate atrophy and chronic small-vessel white matter ischemic changes. 2. No static evidence of acute injury to the cervical spine. Moderate multilevel degenerative changes. 3. LEFT pleural effusion, which appears moderate and is not well visualized on recent chest radiograph Electronically Signed   By: Margarette Canada M.D.   On: 05/07/2019 12:48   Dg Chest Portable 1 View  Result Date: 05/07/2019 CLINICAL DATA:  Confusion and dementia. EXAM: PORTABLE CHEST 1 VIEW COMPARISON:  05/30/2016 FINDINGS: The cardiomediastinal silhouette is unremarkable. New interstitial and possible airspace opacities within the LEFT lung noted. No definite pleural effusion or pneumothorax. No acute bony abnormality noted. IMPRESSION: New LEFT lung interstitial and possible airspace opacities. This may represent infection or asymmetric edema. Electronically Signed   By: Margarette Canada M.D.   On: 05/07/2019 12:31    Pending Labs Unresulted Labs (From admission, onward)    Start     Ordered   05/08/19 0500  Lipid panel  Tomorrow morning,   STAT     05/07/19 1430   05/08/19 0500  TSH  Tomorrow morning,   STAT     05/07/19 1430   05/08/19 0500  Hemoglobin A1c  Tomorrow morning,   STAT     05/07/19 1430   05/07/19 1255  Brain natriuretic peptide  Once,   STAT     05/07/19 1254   05/07/19 1255  Blood culture (routine x 2)  BLOOD CULTURE X 2,   STAT     05/07/19 1254   Signed and Held  Hemoglobin A1c  Tomorrow morning,   R     Signed and Held   Signed and Held  Lipid panel  Tomorrow morning,   R    Comments: Fasting    Signed and Held   Signed and Held  CBC  (enoxaparin (LOVENOX)    CrCl >/= 30 ml/min)  Once,   R    Comments: Baseline for  enoxaparin therapy IF NOT ALREADY DRAWN.  Notify MD if PLT < 100 K.  Signed and Held   Signed and Held  Creatinine, serum  (enoxaparin (LOVENOX)    CrCl >/= 30 ml/min)  Once,   R    Comments: Baseline for enoxaparin therapy IF NOT ALREADY DRAWN.    Signed and Held   Signed and Held  Creatinine, serum  (enoxaparin (LOVENOX)    CrCl >/= 30 ml/min)  Weekly,   R    Comments: while on enoxaparin therapy    Signed and Held          Vitals/Pain Today's Vitals   05/07/19 1200 05/07/19 1230 05/07/19 1400 05/07/19 1430  BP: (!) 151/78 (!) 169/100 (!) 165/89 (!) 158/78  Pulse: 94 94  98  Resp: 19   18  Temp:      SpO2: 93% 91%  93%  Weight:      PainSc:        Isolation Precautions No active isolations  Medications Medications  potassium chloride SA (K-DUR) CR tablet 40 mEq (has no administration in time range)  sodium chloride 0.9 % bolus 500 mL (0 mLs Intravenous Stopped 05/07/19 1408)    Mobility walks Moderate fall risk   Focused Assessments Neuro Assessment Handoff:  Swallow screen pass? Yes    NIH Stroke Scale ( + Modified Stroke Scale Criteria)  LOC Questions (1b. )   +: Answers neither question correctly(unsure if pt is understanding d/t hearing loss) LOC Commands (1c. )   + : Performs one task correctly Best Gaze (2. )  +: Normal Visual (3. )  +: No visual loss Motor Arm, Left (5a. )   +: No drift Motor Arm, Right (5b. )   +: No drift Motor Leg, Left (6a. )   +: No drift Motor Leg, Right (6b. )   +: No drift Sensory (8. )   +: Normal, no sensory loss Best Language (9. )   +: Mild-to-moderate aphasia Extinction/Inattention (11.)   +: No Abnormality Modified SS Total  +: 4     Neuro Assessment: Exceptions to WDL Neuro Checks:      Last Documented NIHSS Modified Score: 4 (05/07/19 1237) Has TPA been given? No If patient is a Neuro Trauma and patient is going to OR before floor call report to [place] nurse: [phone] or  [phone]     R Recommendations: See Admitting Provider Note  Report given to:   Additional Notes:

## 2019-05-08 ENCOUNTER — Observation Stay
Admit: 2019-05-08 | Discharge: 2019-05-08 | Disposition: A | Discharge status: Home / Self Care | Payer: Medicare Other | Attending: Internal Medicine | Admitting: Internal Medicine

## 2019-05-08 ENCOUNTER — Observation Stay: Payer: Medicare Other

## 2019-05-08 LAB — LIPID PANEL
Cholesterol: 202 mg/dL — ABNORMAL HIGH (ref 0–200)
HDL: 34 mg/dL — ABNORMAL LOW (ref >40)
LDL Cholesterol: 138 mg/dL — ABNORMAL HIGH (ref 0–99)
Total CHOL/HDL Ratio: 5.9 RATIO
Triglycerides: 148 mg/dL (ref <150)
VLDL: 30 mg/dL (ref 0–40)

## 2019-05-08 LAB — TSH: TSH: 1.759 u[IU]/mL (ref 0.350–4.500)

## 2019-05-08 LAB — HEMOGLOBIN A1C
Hgb A1c MFr Bld: 5.2 % (ref 4.8–5.6)
Mean Plasma Glucose: 102.54 mg/dL

## 2019-05-08 MED ORDER — LEVOFLOXACIN 500 MG PO TABS
500.0000 mg | ORAL_TABLET | Freq: Every day | ORAL | Status: DC
  Administered 2019-05-08: 12:00:00 500 mg via ORAL
  Filled 2019-05-08: qty 1

## 2019-05-08 MED ORDER — EZETIMIBE 10 MG PO TABS
10.0000 mg | ORAL_TABLET | Freq: Every day | ORAL | Status: DC
  Administered 2019-05-08: 12:00:00 10 mg via ORAL
  Filled 2019-05-08: qty 1

## 2019-05-08 MED ORDER — LEVOFLOXACIN 500 MG PO TABS: 500.0000 mg | ORAL_TABLET | Freq: Every day | ORAL | 0 refills | Status: AC

## 2019-05-08 NOTE — Evaluation (Signed)
Physical Therapy Evaluation Patient Details Name: Roberto Adams MRN: [id number] DOB: 08/07/1935 Today's Date: 05/08/2019   History of Present Illness  Roberto Adams is an 59yoM who comes to [facility] on 7/25 Adams progressive confusion, found in driveway by wife. PMH: TIA Adams aphasia, dementia, CAD, Rt Lung CA s/p lobe resection, HTN, HOH. CT of neck and head negative for acute findings. head MRI/MRA shows no acute infarct, but does show chronic Right ICA occlusion.  Clinical Impression  Pt admitted with above diagnosis. Pt currently with functional limitations due to the deficits listed below (see "PT Problem List"). Upon entry, pt in bed, awake and agreeable to participate. The pt is alert and oriented x3, pleasant, conversational, and generally a poor historian with baseline memory deficits, very HOH but odes well if screaming into his hear at short range. minA to EOB and MinA to rise to standing, eventually able to rise with minGuard assist. MinGuard for AMB in hallway and minGuard for stairs performance. Pt has poor safety awareness trying to AMB up stairs with RW FWD on stairs, then stopping while on stairs to flex forward and fix socks. Functional mobility assessment demonstrates increased effort/time requirements, fair tolerance, and need for intermittent physical assistance, whereas the patient performed these presumably at a higher level of independence PTA as he typically uses a SPC most days. Pt will need close supervision for all mobility for safety at DC. Pt will benefit from skilled PT intervention to increase independence and safety with basic mobility in preparation for discharge to the venue listed below.      Follow Up Recommendations Home health PT    Equipment Recommendations  Rolling walker with 5" wheels    Recommendations for Other Services       Precautions / Restrictions Precautions Precautions: Fall Restrictions Weight Bearing Restrictions: No      Mobility  Bed  Mobility Overal bed mobility: Needs Assistance Bed Mobility: Sit to Supine       Sit to supine: Min assist   General bed mobility comments: max effort and +time, pt weak but persistent  Transfers Overall transfer level: Needs assistance Equipment used: Rolling walker (2 wheeled) Transfers: Sit to/from Stand Sit to Stand: Min assist;Min guard         General transfer comment: variable performance throughout session, but improved independence with repitition  Ambulation/Gait   Gait Distance (Feet): 90 Feet(21ft in room, then 35ft in the hallway) Assistive device: Rolling walker (2 wheeled) Gait Pattern/deviations: Step-to pattern     General Gait Details: 2-point gait, knee buckling well controlled, forward lean well controlled, heavy BUE support on RW; Pt attests to baseline effort level  Stairs Stairs: Yes Stairs assistance: Min guard Stair Management: Two rails Number of Stairs: 4(follows verbal cues well, but has poor safety awareness)    Wheelchair Mobility    Modified Rankin (Stroke Patients Only)       Balance Overall balance assessment: Needs assistance Sitting-balance support: No upper extremity supported;Feet supported Sitting balance-Leahy Scale: Good     Standing balance support: During functional activity;Bilateral upper extremity supported Standing balance-Leahy Scale: Fair                               Pertinent Vitals/Pain Pain Assessment: No/denies pain    Home Living Family/patient expects to be discharged to:: Private residence Living Arrangements: Spouse/significant other Available Help at Discharge: Family Type of Home: House Home Access: Stairs to enter  Entrance Stairs-Rails: Left Entrance Stairs-Number of Steps: 4 Home Layout: One level Home Equipment: Walker - standard;Cane - single point Additional Comments: unclear of full inventory, pt unable to provide detail    Prior Function Level of Independence: Needs  assistance   Gait / Transfers Assistance Needed: SPC use for household distance AMB, occasional SW use "When I need it"  ADL's / Homemaking Assistance Needed: Supervision from wife for some ADLl assist with IADL        Hand Dominance        Extremity/Trunk Assessment   Upper Extremity Assessment Upper Extremity Assessment: Overall WFL for tasks assessed;Generalized weakness    Lower Extremity Assessment Lower Extremity Assessment: Overall WFL for tasks assessed;Generalized weakness    Cervical / Trunk Assessment Cervical / Trunk Assessment: Kyphotic  Communication   Communication: HOH  Cognition Arousal/Alertness: Awake/alert Behavior During Therapy: WFL for tasks assessed/performed Overall Cognitive Status: History of cognitive impairments - at baseline                                        General Comments      Exercises     Assessment/Plan    PT Assessment Patient needs continued PT services  PT Problem List Decreased strength;Decreased balance;Decreased mobility;Decreased safety awareness;Decreased knowledge of precautions       PT Treatment Interventions DME instruction;Balance training;Gait training;Stair training;Functional mobility training;Therapeutic activities;Therapeutic exercise;Patient/family education    PT Goals (Current goals can be found in the Care Plan section)  Acute Rehab PT Goals Patient Stated Goal: return to home PT Goal Formulation: With patient Time For Goal Achievement: 05/22/19 Potential to Achieve Goals: Good    Frequency Min 2X/week   Barriers to discharge        Co-evaluation               AM-PAC PT "6 Clicks" Mobility  Outcome Measure Help needed turning from your back to your side while in a flat bed without using bedrails?: A Little Help needed moving from lying on your back to sitting on the side of a flat bed without using bedrails?: A Little Help needed moving to and from a bed to a chair  (including a wheelchair)?: A Little Help needed standing up from a chair using your arms (e.g., wheelchair or bedside chair)?: A Little Help needed to walk in hospital room?: A Little Help needed climbing 3-5 steps with a railing? : A Little 6 Click Score: 18    End of Session Equipment Utilized During Treatment: Gait belt Activity Tolerance: Patient tolerated treatment well Patient left: in chair;with call bell/phone within reach;with chair alarm set Nurse Communication: Mobility status PT Visit Diagnosis: Unsteadiness on feet (R26.81);Difficulty in walking, not elsewhere classified (R26.2);Muscle weakness (generalized) (M62.81)    Time: 6213-0865 PT Time Calculation (min) (ACUTE ONLY): 31 min   Charges:   PT Evaluation $PT Eval Moderate Complexity: 1 Mod PT Treatments $Gait Training: 8-22 mins        12:22 PM, 05/08/19 Etta Grandchild, PT, DPT Physical Therapist - [facility]  [phone] ([place])    Roberto Adams 05/08/2019, 12:19 PM

## 2019-05-08 NOTE — Progress Notes (Signed)
[facility] [place] NAME: Zared Knoth    MR#:  [id number]  DATE OF BIRTH:  1935/09/10  SUBJECTIVE:   Patient admitted to the hospital secondary to periods of confusion and suspected to have a CVA/TIA.  Neurologic work-up so far has been negative.  Mental status is improved and close to baseline now.  Patient denies any shortness of breath, fever chills, dysuria or any other associated symptoms.  REVIEW OF SYSTEMS:    Review of Systems  Constitutional: Negative for chills and fever.  HENT: Negative for congestion and tinnitus.   Eyes: Negative for blurred vision and double vision.  Respiratory: Negative for cough, shortness of breath and wheezing.   Cardiovascular: Negative for chest pain, orthopnea and PND.  Gastrointestinal: Negative for abdominal pain, diarrhea, nausea and vomiting.  Genitourinary: Negative for dysuria and hematuria.  Neurological: Positive for weakness (generalized. ). Negative for dizziness, sensory change and focal weakness.  All other systems reviewed and are negative.   Nutrition:  Heart healthy Tolerating Diet: Yes Tolerating PT: Await Eval.   DRUG ALLERGIES:   Allergies  Allergen Reactions   Statins Other (See Comments)    Other reaction(s): Muscle Pain Other reaction(s): Muscle Pain    VITALS:  Blood pressure 125/74, pulse 88, temperature 98.1 F (36.7 C), temperature source Oral, resp. rate 18, weight 85.5 kg, SpO2 94 %.  PHYSICAL EXAMINATION:   Physical Exam  GENERAL:  84 y.o.-year-old patient lying in bed in no acute distress.  EYES: Pupils equal, round, reactive to light and accommodation. No scleral icterus. Extraocular muscles intact.  HEENT: Head atraumatic, normocephalic. Oropharynx and nasopharynx clear.  NECK:  Supple, no jugular venous distention. No thyroid enlargement, no tenderness.  LUNGS: Normal breath sounds bilaterally, no wheezing, rales, rhonchi. No use of accessory  muscles of respiration.  CARDIOVASCULAR: S1, S2 normal. No murmurs, rubs, or gallops.  ABDOMEN: Soft, nontender, nondistended. Bowel sounds present. No organomegaly or mass.  EXTREMITIES: No cyanosis, clubbing or edema b/l.    NEUROLOGIC: Cranial nerves II through XII are intact. No focal Motor or sensory deficits b/l. Globally weak.   PSYCHIATRIC: The patient is alert and oriented x 3.  SKIN: No obvious rash, lesion, or ulcer.    LABORATORY PANEL:   CBC Recent Labs  Lab 05/07/19 1159  WBC 7.5  HGB 12.8*  HCT 37.9*  PLT 181   ------------------------------------------------------------------------------------------------------------------  Chemistries  Recent Labs  Lab 05/07/19 1159  NA 139  K 3.3*  CL 102  CO2 26  GLUCOSE 101*  BUN 16  CREATININE 0.97  CALCIUM 9.4  AST 26  ALT 11  ALKPHOS 87  BILITOT 1.1   ------------------------------------------------------------------------------------------------------------------  Cardiac Enzymes No results for input(s): TROPONINI in the last 168 hours. ------------------------------------------------------------------------------------------------------------------  RADIOLOGY:  Ct Angio Head W Or Wo Contrast  Result Date: 05/07/2019 CLINICAL DATA:  Increased confusion. Dementia. Agitation is worse today. Stroke, follow-up. EXAM: CT ANGIOGRAPHY HEAD TECHNIQUE: Multidetector CT imaging of the head was performed using the standard protocol during bolus administration of intravenous contrast. Multiplanar CT image reconstructions and MIPs were obtained to evaluate the vascular anatomy. CONTRAST:  49mL OMNIPAQUE IOHEXOL 350 MG/ML SOLN COMPARISON:  CT head without contrast 05/07/2019. MRA head 08/09/15. FINDINGS: CTA HEAD Anterior circulation: The right internal carotid artery is occluded. Vessel is reconstituted at the level of the ophthalmic artery. There is no contrast in the petrous or upper cervical segment. The right ICA  terminus is intact. Atherosclerotic calcifications  are present in the left cavernous internal carotid artery without a significant stenosis relative to the more distal vessel. Terminal left ICA is within normal limits. Terminal left ICA is larger than the right. The A1 and M1 segments are normal. MCA bifurcations are intact. ACA and MCA branch vessels are within normal limits. There is slight attenuation of distal right MCA branch vessels compared to the left. Posterior circulation: The left vertebral artery is the dominant vessel. Atherosclerotic calcifications are present at the dural margin of the right vertebral artery. Is moderate narrowing of the right V4 segment. Vertebrobasilar junction is normal. The basilar artery is within normal limits. Both posterior cerebral arteries originate from the basilar tip. PCA branch vessels are within normal limits. Venous sinuses: Dural sinuses and venous structures are not well seen due to early arterial timing. Anatomic variants: None IMPRESSION: 1. Occluded right internal carotid artery with reconstitution at the level of the right ophthalmic artery. This is chronic. 2. There is some attenuation of distal right MCA branch vessels compared to the left without a significant proximal stenosis or occlusion. 3. 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 Electronically Signed   By: San Morelle M.D.   On: 05/07/2019 17:49   Ct Head Wo Contrast  Result Date: 05/07/2019 CLINICAL DATA:  84 year old male with altered mental status and possible C-spine injury. EXAM: CT HEAD WITHOUT CONTRAST CT CERVICAL SPINE WITHOUT CONTRAST TECHNIQUE: Multidetector CT imaging of the head and cervical spine was performed following the standard protocol without intravenous contrast. Multiplanar CT image reconstructions of the cervical spine were also generated. COMPARISON:  05/30/2016 no prior exams FINDINGS: CT HEAD FINDINGS Brain: No evidence of acute  infarction, hemorrhage, hydrocephalus, extra-axial collection or mass lesion/mass effect. Moderate atrophy and chronic small-vessel white matter ischemic changes again noted. Vascular: Carotid and vertebral atherosclerotic calcifications identified. Skull: Normal. Negative for fracture or focal lesion. Sinuses/Orbits: No acute finding. Other: None. CT CERVICAL SPINE FINDINGS Alignment: Normal. Skull base and vertebrae: No acute fracture. No primary bone lesion or focal pathologic process. Soft tissues and spinal canal: No prevertebral fluid or swelling. No visible canal hematoma. Disc levels: Mild multilevel degenerative disc disease/spondylosis and moderate multilevel facet arthropathy noted contributing to central spinal and foraminal narrowing at several levels. Upper chest: A LEFT pleural effusion is noted and appears moderate. Other: None IMPRESSION: 1. No evidence of acute intracranial abnormality. Moderate atrophy and chronic small-vessel white matter ischemic changes. 2. No static evidence of acute injury to the cervical spine. Moderate multilevel degenerative changes. 3. LEFT pleural effusion, which appears moderate and is not well visualized on recent chest radiograph Electronically Signed   By: Margarette Canada M.D.   On: 05/07/2019 12:48   Ct Cervical Spine Wo Contrast  Result Date: 05/07/2019 CLINICAL DATA:  84 year old male with altered mental status and possible C-spine injury. EXAM: CT HEAD WITHOUT CONTRAST CT CERVICAL SPINE WITHOUT CONTRAST TECHNIQUE: Multidetector CT imaging of the head and cervical spine was performed following the standard protocol without intravenous contrast. Multiplanar CT image reconstructions of the cervical spine were also generated. COMPARISON:  05/30/2016 no prior exams FINDINGS: CT HEAD FINDINGS Brain: No evidence of acute infarction, hemorrhage, hydrocephalus, extra-axial collection or mass lesion/mass effect. Moderate atrophy and chronic small-vessel white matter  ischemic changes again noted. Vascular: Carotid and vertebral atherosclerotic calcifications identified. Skull: Normal. Negative for fracture or focal lesion. Sinuses/Orbits: No acute finding. Other: None. CT CERVICAL SPINE FINDINGS Alignment: Normal. Skull base and vertebrae: No acute fracture. No  primary bone lesion or focal pathologic process. Soft tissues and spinal canal: No prevertebral fluid or swelling. No visible canal hematoma. Disc levels: Mild multilevel degenerative disc disease/spondylosis and moderate multilevel facet arthropathy noted contributing to central spinal and foraminal narrowing at several levels. Upper chest: A LEFT pleural effusion is noted and appears moderate. Other: None IMPRESSION: 1. No evidence of acute intracranial abnormality. Moderate atrophy and chronic small-vessel white matter ischemic changes. 2. No static evidence of acute injury to the cervical spine. Moderate multilevel degenerative changes. 3. LEFT pleural effusion, which appears moderate and is not well visualized on recent chest radiograph Electronically Signed   By: Margarette Canada M.D.   On: 05/07/2019 12:48   Mr Brain Wo Contrast  Result Date: 05/07/2019 CLINICAL DATA:  Altered mental status.  Slurred speech. EXAM: MRI HEAD WITHOUT CONTRAST TECHNIQUE: Multiplanar, multiecho pulse sequences of the brain and surrounding structures were obtained without intravenous contrast. COMPARISON:  MRI brain CT head and CTA head 05/07/2019. MRI brain 08/09/2015 FINDINGS: Brain: Advanced atrophy and diffuse white matter disease is stable. No acute infarct, hemorrhage, or mass lesion is present. The ventricles are proportionate to the degree of atrophy. A remote lacunar infarct is present left lentiform nucleus. No significant extraaxial fluid collection is present. Vascular: The right internal carotid artery is chronically occluded vessel is reconstituted at the level of the ophthalmic or posterior communicating artery. Flow is  present in the left internal carotid artery and in the posterior circulation. Skull and upper cervical spine: The craniocervical junction is normal. Upper cervical spine is within normal limits. Marrow signal is unremarkable. Sinuses/Orbits: The paranasal sinuses and mastoid air cells are clear. A left lens replacement is present. Globes and orbits are otherwise within normal limits. IMPRESSION: 1. Stable chronic atrophy and white matter disease. 2. Remote lacunar infarct of the left lentiform nucleus. 3. Chronic occlusion of the right internal carotid artery. Electronically Signed   By: San Morelle M.D.   On: 05/07/2019 20:11   US Carotid Bilateral (at [facility] Only)  Result Date: 05/08/2019 CLINICAL DATA:  TIA.  History of hypertension, CAD and smoking. EXAM: BILATERAL CAROTID DUPLEX ULTRASOUND TECHNIQUE: Pearline Cables scale imaging, color Doppler and duplex ultrasound were performed of bilateral carotid and vertebral arteries in the neck. COMPARISON:  Brain MRI-08/09/2015 FINDINGS: Criteria: Quantification of carotid stenosis is based on velocity parameters that correlate the residual internal carotid diameter with NASCET-based stenosis levels, using the diameter of the distal internal carotid lumen as the denominator for stenosis measurement. The following velocity measurements were obtained: RIGHT ICA: Occluded at its origin CCA: 94/1 cm/sec SYSTOLIC ICA/CCA RATIO:  N/A ECA: 44 cm/sec LEFT ICA: 82/23 cm/sec CCA: 74/0 cm/sec SYSTOLIC ICA/CCA RATIO:  0.9 ECA: 128 cm/sec RIGHT CAROTID ARTERY: Chronic occlusion of the right internal carotid artery as demonstrated on remote brain MRI performed 07/2015. RIGHT VERTEBRAL ARTERY:  Antegrade Flow LEFT CAROTID ARTERY: There is a minimal to moderate amount of eccentric echogenic plaque within the left carotid bulb, extending to involve the origin and proximal aspects of the left internal carotid artery, not resulting in elevated peak systolic velocities within the  interrogated course the left internal carotid artery to suggest a hemodynamically significant stenosis, though note, velocity measurements are unreliable in the setting of a contralateral occlusion. LEFT VERTEBRAL ARTERY:  Antegrade Flow IMPRESSION: 1. Chronic occlusion of the right internal carotid artery as demonstrated on remote brain MRI performed 07/2015. 2. Minimal to moderate amount of left-sided atherosclerotic plaque, not resulting in elevated  peak systolic velocities within the left internal carotid artery, though note, velocity measurements are unreliable in the setting of a contralateral occlusion. Further evaluation with CTA could be performed as indicated. 3. Antegrade flow demonstrated within the bilateral vertebral arteries. Electronically Signed   By: Sandi Mariscal M.D.   On: 05/08/2019 09:11   Dg Chest Port 1 View  Result Date: 05/08/2019 CLINICAL DATA:  84 year old male former smoker with history of right-sided lung cancer. Evaluate for pleural effusion. EXAM: PORTABLE CHEST 1 VIEW COMPARISON:  Chest x-ray 05/07/2019. FINDINGS: Multifocal interstitial worsening patchy and airspace disease throughout the lungs bilaterally, most severe throughout the left mid to lower lung, increased from yesterday's examination, most compatible with progressive multilobar bronchopneumonia. Possible small right pleural effusion. No definite left pleural effusion. Pulmonary vasculature is largely obscured by the above findings. Heart size is normal. The patient is rotated to the right on today's exam, resulting in distortion of the mediastinal contours and reduced diagnostic sensitivity and specificity for mediastinal pathology. Aortic atherosclerosis. IMPRESSION: 1. The appearance the chest suggests worsening multilobar bronchopneumonia, as above. 2. Aortic atherosclerosis. Electronically Signed   By: Vinnie Langton M.D.   On: 05/08/2019 05:57   Dg Chest Portable 1 View  Result Date: 05/07/2019 CLINICAL  DATA:  Confusion and dementia. EXAM: PORTABLE CHEST 1 VIEW COMPARISON:  05/30/2016 FINDINGS: The cardiomediastinal silhouette is unremarkable. New interstitial and possible airspace opacities within the LEFT lung noted. No definite pleural effusion or pneumothorax. No acute bony abnormality noted. IMPRESSION: New LEFT lung interstitial and possible airspace opacities. This may represent infection or asymmetric edema. Electronically Signed   By: Margarette Canada M.D.   On: 05/07/2019 12:31     ASSESSMENT AND PLAN:   84 year old male with past medical history of hypertension, mild dementia, BPH, osteoarthritis, history of lung cancer, hyperlipidemia who presented to the hospital due to altered mental status.  1.  Altered mental status/confusion- suspected to be secondary to a UTI.  Initially thought to have a TIA/CVA but that has been ruled out.  Patient's neurologic work-up has essentially been normal with a negative MRI and CT head. - Neurology was consulted and they recommend continuing treatment for underlying UTI.  Patient does have history of seizures but has had no activity.  Neurology recommends doing an outpatient EEG if needed. - Continue IV antibiotics for the UTI, await physical therapy evaluation.  2.  UTI- treated with Levaquin and follow urine cultures. -Patient is afebrile and clinically asymptomatic.  3.  Pneumonia-this was instantly noted on chest x-ray.  Patient has a previous history of lung cancer status post lobectomy.  Clinically patient is afebrile, denies any worsening shortness of breath or any respiratory symptoms. -Continue empiric Levaquin.  4.  Essential hypertension-continue lisinopril/HCTZ.  5.  BPH-no urinary retention.  Continue Flomax.  Await physical therapy evaluation and possible discharge later today or possibly tomorrow.  Discussed plan of care with patient's wife over the phone.   All the records are reviewed and case discussed with Care Management/Social  Worker. Management plans discussed with the patient, family and they are in agreement.  CODE STATUS: Full code  DVT Prophylaxis: Lovenox  TOTAL TIME TAKING CARE OF THIS PATIENT: 30 minutes.   POSSIBLE D/C IN 1-2 DAYS, DEPENDING ON CLINICAL CONDITION.   Henreitta Leber M.D on 05/08/2019 at 12:07 PM  Between 7am to 6pm - Pager - [phone]  After 6pm go to [email or web address] - Proofreader  [employer]  [phone]  CC: Primary care physician; Kirk Ruths, MD

## 2019-05-08 NOTE — TOC Transition Note (Signed)
Transition of Care [facility]) - CM/SW Discharge Note   Patient Details  Name: Roberto Adams MRN: [id number] Date of Birth: 07/25/1935  [facility] CM/SW Contact:  Latanya Maudlin, RN Phone Number: 05/08/2019, 3:00 PM   Clinical Narrative:  Patient to be discharged per MD order. Orders in place for home health services. CMS Medicare.gov Compare Post Acute Care list reviewed with patient and they have no preference. [facility] with [facility]. DME orders for RW but patient already has one. Family to transport.      Final next level of care: Home w Home Health Services Barriers to Discharge: No Barriers Identified   Patient Goals and CMS Choice   CMS Medicare.gov Compare Post Acute Care list provided to:: Patient Choice offered to / list presented to : Patient  Discharge Placement                       Discharge Plan and Services                          HH Arranged: PT [facility] Agency: [place] [facility] Date [place]: 05/08/19 Time HH Agency Contacted: 1500 Representative spoke with at [place]: [place] ([place]) Interventions     Readmission Risk Interventions No flowsheet data found.

## 2019-05-08 NOTE — Discharge Summary (Signed)
[facility] NAME: Roberto Adams    MR#:  [id number]  DATE OF BIRTH:  Nov 30, 1934  DATE OF ADMISSION:  05/07/2019 ADMITTING PHYSICIAN: Nicholes Mango, MD  DATE OF DISCHARGE: No discharge date for patient encounter.  PRIMARY CARE PHYSICIAN: Kirk Ruths, MD    ADMISSION DIAGNOSIS:  Altered mental status, unspecified altered mental status type [R41.82]  DISCHARGE DIAGNOSIS:  Principal Problem:   Stroke-like episode ([place])   SECONDARY DIAGNOSIS:   Past Medical History:  Diagnosis Date  . Amnesia, global, transient 08/20/2015   Overview:  Better at [facility], mri negative. On lipitor and plavix. Only issue is insomnia now.    . Arthritis   . Benign prostatic hyperplasia with urinary obstruction 11/06/2012  . CAD (coronary artery disease)   . Cancer of right lung ([place])    Right Upper lobe resection.   . Cancer of skin, squamous cell   . Elevated prostate specific antigen (PSA) 07/05/2012  . Enlarged prostate   . Hearing aid worn    both ears  . History of lung cancer 2001   Rt upper lobe; s/p chemo and radiation with recurrence  . HLD (hyperlipidemia)   . Hypertension   . Lumbar canal stenosis 01/17/2013  . Mild cognitive disorder 07/22/2014   Last Assessment & Plan:  Memory seems to be stable   . Nocturia associated with benign prostatic hypertrophy   . Pure hypercholesterolemia 07/22/2014   Last Assessment & Plan:  Has been working on a low fat diet and no myalgia's are present.    Marland Kitchen Spinal stenosis, lumbar region, with neurogenic claudication 01/17/2013  . TIA (transient ischemic attack) 08/08/2015  . Vasomotor rhinitis 08/08/2014    HOSPITAL COURSE:   84 year old male with past medical history of hypertension, mild dementia, BPH, osteoarthritis, history of lung cancer, hyperlipidemia who presented to the hospital due to altered mental status.  1.  Altered mental status/confusion- suspected to be secondary to a UTI.   Initially thought to have a TIA/CVA but that has been ruled out.  Patient's neurologic work-up has essentially been normal with a negative MRI and CT head. - Neurology was consulted and they recommend continuing treatment for underlying UTI.  Patient does have history of seizures but has had no activity.  Neurology recommends doing an outpatient EEG if needed. Patient seen by physical therapy and the recommend home health services which is being arranged for the patient prior to discharge.  Patient's mental status is back to baseline now.  2.  UTI- based off a urinalysis on admission.  Patient is clinically asymptomatic. -Patient will be discharged on oral Levaquin today.    3.  Pneumonia-this was incidentally noted on chest x-ray.  Patient has a previous history of lung cancer and is status post lobectomy.  Clinically patient is afebrile, denies any worsening shortness of breath or any respiratory symptoms. -Patient will be discharged on empiric Levaquin.  4.  Essential hypertension- pt. Will continue lisinopril/HCTZ.  5.  BPH-no urinary retention.  pt. Will Continue Flomax.  DISCHARGE CONDITIONS:   Stable.   CONSULTS OBTAINED:    DRUG ALLERGIES:   Allergies  Allergen Reactions  . Statins Other (See Comments)    Other reaction(s): Muscle Pain Other reaction(s): Muscle Pain    DISCHARGE MEDICATIONS:   Allergies as of 05/08/2019      Reactions   Statins Other (See Comments)   Other reaction(s): Muscle Pain Other reaction(s): Muscle Pain  Medication List    TAKE these medications   aspirin 81 MG tablet Take 81 mg by mouth daily.   clopidogrel 75 MG tablet Commonly known as: Plavix Take 1 tablet (75 mg total) by mouth daily.   finasteride 5 MG tablet Commonly known as: PROSCAR Take 1 tablet by mouth once daily   levofloxacin 500 MG tablet Commonly known as: LEVAQUIN Take 1 tablet (500 mg total) by mouth daily for 5 days. Start taking on: May 09, 2019    lisinopril-hydrochlorothiazide 20-25 MG tablet Commonly known as: ZESTORETIC Take 1 tablet by mouth daily.   tamsulosin 0.4 MG Caps capsule Commonly known as: FLOMAX Take 1 capsule (0.4 mg total) by mouth daily.   vitamin B-12 1000 MCG tablet Commonly known as: CYANOCOBALAMIN Take 1,000 mcg by mouth daily.            Durable Medical Equipment  (From admission, onward)         Start     Ordered   05/08/19 1416  For home use only DME Walker rolling  [facility])  Once    Question:  Patient needs a walker to treat with the following condition  Answer:  CVA (cerebral vascular accident) ([place])   05/08/19 1416            DISCHARGE INSTRUCTIONS:   DIET:  Cardiac diet  DISCHARGE CONDITION:  Stable  ACTIVITY:  Activity as tolerated  OXYGEN:  Home Oxygen: No.   Oxygen Delivery: room air  DISCHARGE LOCATION:  Home with [place] [facility]   If you experience worsening of your admission symptoms, develop shortness of breath, life threatening emergency, suicidal or homicidal thoughts you must seek medical attention immediately by calling 911 or calling your MD immediately  if symptoms less severe.  You Must read complete instructions/literature along with all the possible adverse reactions/side effects for all the Medicines you take and that have been prescribed to you. Take any new Medicines after you have completely understood and accpet all the possible adverse reactions/side effects.   Please note  You were cared for by a hospitalist during your hospital stay. If you have any questions about your discharge medications or the care you received while you were in the hospital after you are discharged, you can call the unit and asked to speak with the hospitalist on call if the hospitalist that took care of you is not available. Once you are discharged, your primary care physician will handle any further medical issues. Please note that NO REFILLS for any discharge medications  will be authorized once you are discharged, as it is imperative that you return to your primary care physician (or establish a relationship with a primary care physician if you do not have one) for your aftercare needs so that they can reassess your need for medications and monitor your lab values.   DATA REVIEW:   CBC Recent Labs  Lab 05/07/19 1159  WBC 7.5  HGB 12.8*  HCT 37.9*  PLT 181    Chemistries  Recent Labs  Lab 05/07/19 1159  NA 139  K 3.3*  CL 102  CO2 26  GLUCOSE 101*  BUN 16  CREATININE 0.97  CALCIUM 9.4  AST 26  ALT 11  ALKPHOS 87  BILITOT 1.1    Cardiac Enzymes No results for input(s): TROPONINI in the last 168 hours.  Microbiology Results  Results for orders placed or performed during the hospital encounter of 05/07/19  SARS Coronavirus 2 (CEPHEID - Performed in  [facility] hospital lab), Hosp Order     Status: None   Collection Time: 05/07/19 12:40 PM   Specimen: Nasopharyngeal Swab  Result Value Ref Range Status   SARS Coronavirus 2 NEGATIVE NEGATIVE Final    Comment: (NOTE) If result is NEGATIVE SARS-CoV-2 target nucleic acids are NOT DETECTED. The SARS-CoV-2 RNA is generally detectable in upper and lower  respiratory specimens during the acute phase of infection. The lowest  concentration of SARS-CoV-2 viral copies this assay can detect is 250  copies / mL. A negative result does not preclude SARS-CoV-2 infection  and should not be used as the sole basis for treatment or other  patient management decisions.  A negative result may occur with  improper specimen collection / handling, submission of specimen other  than nasopharyngeal swab, presence of viral mutation(s) within the  areas targeted by this assay, and inadequate number of viral copies  (<250 copies / mL). A negative result must be combined with clinical  observations, patient history, and epidemiological information. If result is POSITIVE SARS-CoV-2 target nucleic acids are  DETECTED. The SARS-CoV-2 RNA is generally detectable in upper and lower  respiratory specimens dur ing the acute phase of infection.  Positive  results are indicative of active infection with SARS-CoV-2.  Clinical  correlation with patient history and other diagnostic information is  necessary to determine patient infection status.  Positive results do  not rule out bacterial infection or co-infection with other viruses. If result is PRESUMPTIVE POSTIVE SARS-CoV-2 nucleic acids MAY BE PRESENT.   A presumptive positive result was obtained on the submitted specimen  and confirmed on repeat testing.  While 2019 novel coronavirus  (SARS-CoV-2) nucleic acids may be present in the submitted sample  additional confirmatory testing may be necessary for epidemiological  and / or clinical management purposes  to differentiate between  SARS-CoV-2 and other Sarbecovirus currently known to infect humans.  If clinically indicated additional testing with an alternate test  methodology [phone]) is advised. The SARS-CoV-2 RNA is generally  detectable in upper and lower respiratory sp ecimens during the acute  phase of infection. The expected result is Negative. Fact Sheet for Patients:  StrictlyIdeas.no Fact Sheet for Healthcare Providers: BankingDealers.co.za This test is not yet approved or cleared by the [place] FDA and has been authorized for detection and/or diagnosis of SARS-CoV-2 by FDA under an Emergency Use Authorization (EUA).  This EUA will remain in effect (meaning this test can be used) for the duration of the COVID-19 declaration under Section 564(b)(1) of the Act, 21 U.S.C. section 360bbb-3(b)(1), unless the authorization is terminated or revoked sooner. Performed at [facility], [address] [place], [place] [address]   Blood culture (routine x 2)     Status: None (Preliminary result)   Collection Time: 05/07/19   2:04 PM   Specimen: BLOOD  Result Value Ref Range Status   Specimen Description BLOOD LEFT ANTECUBITAL  Final   Special Requests   Final    BOTTLES DRAWN AEROBIC AND ANAEROBIC Blood Culture adequate volume   Culture   Final    NO GROWTH < 24 HOURS Performed at [facility], [address] [place], [place] [address]    Report Status PENDING  Incomplete  Blood culture (routine x 2)     Status: None (Preliminary result)   Collection Time: 05/07/19  2:15 PM   Specimen: BLOOD  Result Value Ref Range Status   Specimen Description BLOOD BLOOD LEFT WRIST  Final  Special Requests   Final    BOTTLES DRAWN AEROBIC AND ANAEROBIC Blood Culture adequate volume   Culture   Final    NO GROWTH < 24 HOURS Performed at [facility], [address] [place], [place] [address]    Report Status PENDING  Incomplete    RADIOLOGY:  Ct Angio Head W Or Wo Contrast  Result Date: 05/07/2019 CLINICAL DATA:  Increased confusion. Dementia. Agitation is worse today. Stroke, follow-up. EXAM: CT ANGIOGRAPHY HEAD TECHNIQUE: Multidetector CT imaging of the head was performed using the standard protocol during bolus administration of intravenous contrast. Multiplanar CT image reconstructions and MIPs were obtained to evaluate the vascular anatomy. CONTRAST:  85mL OMNIPAQUE IOHEXOL 350 MG/ML SOLN COMPARISON:  CT head without contrast 05/07/2019. MRA head 08/09/15. FINDINGS: CTA HEAD Anterior circulation: The right internal carotid artery is occluded. Vessel is reconstituted at the level of the ophthalmic artery. There is no contrast in the petrous or upper cervical segment. The right ICA terminus is intact. Atherosclerotic calcifications are present in the left cavernous internal carotid artery without a significant stenosis relative to the more distal vessel. Terminal left ICA is within normal limits. Terminal left ICA is larger than the right. The A1 and M1 segments are normal. MCA bifurcations are  intact. ACA and MCA branch vessels are within normal limits. There is slight attenuation of distal right MCA branch vessels compared to the left. Posterior circulation: The left vertebral artery is the dominant vessel. Atherosclerotic calcifications are present at the dural margin of the right vertebral artery. Is moderate narrowing of the right V4 segment. Vertebrobasilar junction is normal. The basilar artery is within normal limits. Both posterior cerebral arteries originate from the basilar tip. PCA branch vessels are within normal limits. Venous sinuses: Dural sinuses and venous structures are not well seen due to early arterial timing. Anatomic variants: None IMPRESSION: 1. Occluded right internal carotid artery with reconstitution at the level of the right ophthalmic artery. This is chronic. 2. There is some attenuation of distal right MCA branch vessels compared to the left without a significant proximal stenosis or occlusion. 3. 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 Electronically Signed   By: San Morelle M.D.   On: 05/07/2019 17:49   Ct Head Wo Contrast  Result Date: 05/07/2019 CLINICAL DATA:  84 year old male with altered mental status and possible C-spine injury. EXAM: CT HEAD WITHOUT CONTRAST CT CERVICAL SPINE WITHOUT CONTRAST TECHNIQUE: Multidetector CT imaging of the head and cervical spine was performed following the standard protocol without intravenous contrast. Multiplanar CT image reconstructions of the cervical spine were also generated. COMPARISON:  05/30/2016 no prior exams FINDINGS: CT HEAD FINDINGS Brain: No evidence of acute infarction, hemorrhage, hydrocephalus, extra-axial collection or mass lesion/mass effect. Moderate atrophy and chronic small-vessel white matter ischemic changes again noted. Vascular: Carotid and vertebral atherosclerotic calcifications identified. Skull: Normal. Negative for fracture or focal lesion. Sinuses/Orbits: No acute  finding. Other: None. CT CERVICAL SPINE FINDINGS Alignment: Normal. Skull base and vertebrae: No acute fracture. No primary bone lesion or focal pathologic process. Soft tissues and spinal canal: No prevertebral fluid or swelling. No visible canal hematoma. Disc levels: Mild multilevel degenerative disc disease/spondylosis and moderate multilevel facet arthropathy noted contributing to central spinal and foraminal narrowing at several levels. Upper chest: A LEFT pleural effusion is noted and appears moderate. Other: None IMPRESSION: 1. No evidence of acute intracranial abnormality. Moderate atrophy and chronic small-vessel white matter ischemic changes. 2. No static evidence of  acute injury to the cervical spine. Moderate multilevel degenerative changes. 3. LEFT pleural effusion, which appears moderate and is not well visualized on recent chest radiograph Electronically Signed   By: Margarette Canada M.D.   On: 05/07/2019 12:48   Ct Cervical Spine Wo Contrast  Result Date: 05/07/2019 CLINICAL DATA:  84 year old male with altered mental status and possible C-spine injury. EXAM: CT HEAD WITHOUT CONTRAST CT CERVICAL SPINE WITHOUT CONTRAST TECHNIQUE: Multidetector CT imaging of the head and cervical spine was performed following the standard protocol without intravenous contrast. Multiplanar CT image reconstructions of the cervical spine were also generated. COMPARISON:  05/30/2016 no prior exams FINDINGS: CT HEAD FINDINGS Brain: No evidence of acute infarction, hemorrhage, hydrocephalus, extra-axial collection or mass lesion/mass effect. Moderate atrophy and chronic small-vessel white matter ischemic changes again noted. Vascular: Carotid and vertebral atherosclerotic calcifications identified. Skull: Normal. Negative for fracture or focal lesion. Sinuses/Orbits: No acute finding. Other: None. CT CERVICAL SPINE FINDINGS Alignment: Normal. Skull base and vertebrae: No acute fracture. No primary bone lesion or focal  pathologic process. Soft tissues and spinal canal: No prevertebral fluid or swelling. No visible canal hematoma. Disc levels: Mild multilevel degenerative disc disease/spondylosis and moderate multilevel facet arthropathy noted contributing to central spinal and foraminal narrowing at several levels. Upper chest: A LEFT pleural effusion is noted and appears moderate. Other: None IMPRESSION: 1. No evidence of acute intracranial abnormality. Moderate atrophy and chronic small-vessel white matter ischemic changes. 2. No static evidence of acute injury to the cervical spine. Moderate multilevel degenerative changes. 3. LEFT pleural effusion, which appears moderate and is not well visualized on recent chest radiograph Electronically Signed   By: Margarette Canada M.D.   On: 05/07/2019 12:48   Mr Brain Wo Contrast  Result Date: 05/07/2019 CLINICAL DATA:  Altered mental status.  Slurred speech. EXAM: MRI HEAD WITHOUT CONTRAST TECHNIQUE: Multiplanar, multiecho pulse sequences of the brain and surrounding structures were obtained without intravenous contrast. COMPARISON:  MRI brain CT head and CTA head 05/07/2019. MRI brain 08/09/2015 FINDINGS: Brain: Advanced atrophy and diffuse white matter disease is stable. No acute infarct, hemorrhage, or mass lesion is present. The ventricles are proportionate to the degree of atrophy. A remote lacunar infarct is present left lentiform nucleus. No significant extraaxial fluid collection is present. Vascular: The right internal carotid artery is chronically occluded vessel is reconstituted at the level of the ophthalmic or posterior communicating artery. Flow is present in the left internal carotid artery and in the posterior circulation. Skull and upper cervical spine: The craniocervical junction is normal. Upper cervical spine is within normal limits. Marrow signal is unremarkable. Sinuses/Orbits: The paranasal sinuses and mastoid air cells are clear. A left lens replacement is present.  Globes and orbits are otherwise within normal limits. IMPRESSION: 1. Stable chronic atrophy and white matter disease. 2. Remote lacunar infarct of the left lentiform nucleus. 3. Chronic occlusion of the right internal carotid artery. Electronically Signed   By: San Morelle M.D.   On: 05/07/2019 20:11   US Carotid Bilateral (at [facility] And [facility] Only)  Result Date: 05/08/2019 CLINICAL DATA:  TIA.  History of hypertension, CAD and smoking. EXAM: BILATERAL CAROTID DUPLEX ULTRASOUND TECHNIQUE: Pearline Cables scale imaging, color Doppler and duplex ultrasound were performed of bilateral carotid and vertebral arteries in the neck. COMPARISON:  Brain MRI-08/09/2015 FINDINGS: Criteria: Quantification of carotid stenosis is based on velocity parameters that correlate the residual internal carotid diameter with NASCET-based stenosis levels, using the diameter of the distal internal carotid lumen  as the denominator for stenosis measurement. The following velocity measurements were obtained: RIGHT ICA: Occluded at its origin CCA: 75/1 cm/sec SYSTOLIC ICA/CCA RATIO:  N/A ECA: 44 cm/sec LEFT ICA: 82/23 cm/sec CCA: 02/5 cm/sec SYSTOLIC ICA/CCA RATIO:  0.9 ECA: 128 cm/sec RIGHT CAROTID ARTERY: Chronic occlusion of the right internal carotid artery as demonstrated on remote brain MRI performed 07/2015. RIGHT VERTEBRAL ARTERY:  Antegrade Flow LEFT CAROTID ARTERY: There is a minimal to moderate amount of eccentric echogenic plaque within the left carotid bulb, extending to involve the origin and proximal aspects of the left internal carotid artery, not resulting in elevated peak systolic velocities within the interrogated course the left internal carotid artery to suggest a hemodynamically significant stenosis, though note, velocity measurements are unreliable in the setting of a contralateral occlusion. LEFT VERTEBRAL ARTERY:  Antegrade Flow IMPRESSION: 1. Chronic occlusion of the right internal carotid artery as demonstrated on remote  brain MRI performed 07/2015. 2. Minimal to moderate amount of left-sided atherosclerotic plaque, not resulting in elevated peak systolic velocities within the left internal carotid artery, though note, velocity measurements are unreliable in the setting of a contralateral occlusion. Further evaluation with CTA could be performed as indicated. 3. Antegrade flow demonstrated within the bilateral vertebral arteries. Electronically Signed   By: Sandi Mariscal M.D.   On: 05/08/2019 09:11   Dg Chest Port 1 View  Result Date: 05/08/2019 CLINICAL DATA:  84 year old male former smoker with history of right-sided lung cancer. Evaluate for pleural effusion. EXAM: PORTABLE CHEST 1 VIEW COMPARISON:  Chest x-ray 05/07/2019. FINDINGS: Multifocal interstitial worsening patchy and airspace disease throughout the lungs bilaterally, most severe throughout the left mid to lower lung, increased from yesterday's examination, most compatible with progressive multilobar bronchopneumonia. Possible small right pleural effusion. No definite left pleural effusion. Pulmonary vasculature is largely obscured by the above findings. Heart size is normal. The patient is rotated to the right on today's exam, resulting in distortion of the mediastinal contours and reduced diagnostic sensitivity and specificity for mediastinal pathology. Aortic atherosclerosis. IMPRESSION: 1. The appearance the chest suggests worsening multilobar bronchopneumonia, as above. 2. Aortic atherosclerosis. Electronically Signed   By: Vinnie Langton M.D.   On: 05/08/2019 05:57   Dg Chest Portable 1 View  Result Date: 05/07/2019 CLINICAL DATA:  Confusion and dementia. EXAM: PORTABLE CHEST 1 VIEW COMPARISON:  05/30/2016 FINDINGS: The cardiomediastinal silhouette is unremarkable. New interstitial and possible airspace opacities within the LEFT lung noted. No definite pleural effusion or pneumothorax. No acute bony abnormality noted. IMPRESSION: New LEFT lung interstitial  and possible airspace opacities. This may represent infection or asymmetric edema. Electronically Signed   By: Margarette Canada M.D.   On: 05/07/2019 12:31      Management plans discussed with the patient, family and they are in agreement.  CODE STATUS:     Code Status Orders  (From admission, onward)         Start     Ordered   05/07/19 1558  Full code  Continuous     05/07/19 1557        TOTAL TIME TAKING CARE OF THIS PATIENT: 40 minutes.    Henreitta Leber M.D on 05/08/2019 at 2:34 PM  Between 7am to 6pm - Pager - [phone]  After 6pm go to [email or web address] - [employer] [facility]  Office  [phone]  CC: Primary care physician; Kirk Ruths, MD

## 2019-05-08 NOTE — Plan of Care (Signed)
  Problem: Education: Goal: Knowledge of secondary prevention will improve Outcome: Progressing   Problem: Education: Goal: Knowledge of patient specific risk factors addressed and post discharge goals established will improve Outcome: Progressing

## 2019-05-08 NOTE — Progress Notes (Addendum)
Neurology Follow-up Progress Note Center at [facility]  Date of Admission: 05/07/2019   ASSESSMENT:  Roberto Adams is a 84 y.o. 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 Initial exam was limited by hearing impairment but even with written language patient does not comprehend. He is moving all four extremities. He reportedly had a similar episode in the past and it was diagnosed as a TIA. He was started on DAPT, which he still continues to this day. Unclear what these episodes are. Patient did have an EEG in the past that did show right posterior temporal spike type epileptiform discharge. Patient is not on any AED.  Work-up thus far has been unremarkable including negative MRI brain. Patient is back to baseline this morning. He is appropriately answering questions and is oriented. He is amnestic to what happened yesterday. At this time, differential is broad but would include focal seizures vs. TIA vs encephalopathy secondary to infection (UTI; though this hasn't been treated and he still improved so less likely). Patient has had prior episodes like this and MRI brains have been negative for any acute infarct. EEG in the past did demonstrate abnormal findings so complex partial seizures is the more likely diagnosis at this time.   PLAN: - If patient remains in the hospital, should consider getting a routine EEG tomorrow when it is available. Low threshold to start anti-epileptics. NOTE: If AED is needed, patient shouldn't be started on keppra given documented behavioral issues with episodes of angry outbursts. Consider VIMPAT if possible.  - If patient is being discharged today, patient should follow up with his outpatient neurologist and strongly consider repeating an ambulatory EEG again.  - Will follow up on Carotid ultrasound and TTE reads  - Continue with  home ASA and Plavix. Allergy to statin so started Zetia 10 mg daily.  - Please call neurology with any further questions or concerns     SUBJECTIVE:  No acute events overnight. Symptoms have resolved. Patient states that he is feeling normal again this morning. He doesn't recall what happened yesterday.    OBJECTIVE: Vital Signs Temp:  [97.6 F (36.4 C)-99 F (37.2 C)] 98.3 F (36.8 C) (07/26 0407) Pulse Rate:  [87-110] 87 (07/26 0407) Resp:  [16-20] 16 (07/26 0407) BP: (94-169)/(38-107) 110/51 (07/26 0407) SpO2:  [91 %-98 %] 94 % (07/26 0407) Weight:  [85.5 kg] 85.5 kg (07/25 1152)   Physical Exam:  GENERAL:  No acute distress.   MENTAL STATUS EXAM:    Orientation: Alert and oriented to person, place and time.  Memory: Cooperative, follows commands well.  Language: Speech is clear and language is normal.   CRANIAL NERVES:    CN 2 (Optic): Visual fields intact to confrontation  CN 3,4,6 (EOM): Pupils equal and reactive to light. Full extraocular eye movement without nystagmus.  CN 5 (Trigeminal): Facial sensation is normal, no weakness of masticatory muscles.  CN 7 (Facial): No facial weakness or asymmetry.  CN 8 (Auditory): Auditory acuity grossly impaired.  CN 9,10 (Glossophar): The uvula is midline, the palate elevates symmetrically.  CN 11 (spinal access): Normal sternocleidomastoid and trapezius strength.  CN 12 (Hypoglossal): The tongue is midline. No atrophy or fasciculations.Marland Kitchen   MOTOR:  Muscle Strength: Strength - 5/5 and symmetric in the upper and lower extremities, no pronation or drift. Muscle Tone: Tone and muscle bulk are normal in the upper and  lower extremities.   SENSATION:   Intact to light touch   GAIT: Deferred  Labs: I've reviewed the labs for today   Diagnostic Results: - MRI Brain w/o: no acute infarct. Remote lacunar infarct of the left lentiform nucleus. - CTA head w/o: Occluded right internal carotid artery with reconstitution at the level of  the right ophthalmic artery. This is chronic. There is some attenuation of distal right MCA branch vessels compared to the left without a significant proximal stenosis or occlusion. 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 - Carotid U/S: pending - TTE: pending

## 2019-05-08 NOTE — Progress Notes (Signed)
Pt is being discharged home with [facility].  Discharge papers given and explained to pt, but needs reinforcement.  Called pt's spouse, Iktan Aikman to explain and educate about discharge papers including meds and f/u appointments. Spouse verbalized understanding. Rx given. Awaiting transportation.

## 2019-05-09 LAB — ECHOCARDIOGRAM COMPLETE: Weight: 3017.6 oz

## 2019-05-12 LAB — CULTURE, BLOOD (ROUTINE X 2)
Culture: NO GROWTH
Culture: NO GROWTH
Special Requests: ADEQUATE
Special Requests: ADEQUATE

## 2019-05-16 ENCOUNTER — Inpatient Hospital Stay
Admission: EM | Admit: 2019-05-16 | Discharge: 2019-05-23 | DRG: 309 | Disposition: A | Discharge status: Skilled Nursing Facility | Payer: Medicare Other | Attending: Internal Medicine | Admitting: Internal Medicine

## 2019-05-16 ENCOUNTER — Emergency Department: Payer: Medicare Other

## 2019-05-16 ENCOUNTER — Other Ambulatory Visit: Payer: Self-pay

## 2019-05-16 ENCOUNTER — Encounter: Payer: Self-pay | Admitting: Emergency Medicine

## 2019-05-16 DIAGNOSIS — Z515 Encounter for palliative care: Secondary | ICD-10-CM | POA: Diagnosis not present

## 2019-05-16 DIAGNOSIS — Z7189 Other specified counseling: Secondary | ICD-10-CM | POA: Diagnosis not present

## 2019-05-16 DIAGNOSIS — Z7902 Long term (current) use of antithrombotics/antiplatelets: Secondary | ICD-10-CM | POA: Diagnosis not present

## 2019-05-16 DIAGNOSIS — F0390 Unspecified dementia without behavioral disturbance: Secondary | ICD-10-CM | POA: Diagnosis present

## 2019-05-16 DIAGNOSIS — Z7982 Long term (current) use of aspirin: Secondary | ICD-10-CM

## 2019-05-16 DIAGNOSIS — Z923 Personal history of irradiation: Secondary | ICD-10-CM

## 2019-05-16 DIAGNOSIS — N390 Urinary tract infection, site not specified: Secondary | ICD-10-CM | POA: Diagnosis present

## 2019-05-16 DIAGNOSIS — I10 Essential (primary) hypertension: Secondary | ICD-10-CM | POA: Diagnosis present

## 2019-05-16 DIAGNOSIS — R000 Tachycardia, unspecified: Secondary | ICD-10-CM

## 2019-05-16 DIAGNOSIS — N401 Enlarged prostate with lower urinary tract symptoms: Secondary | ICD-10-CM | POA: Diagnosis present

## 2019-05-16 DIAGNOSIS — I2510 Atherosclerotic heart disease of native coronary artery without angina pectoris: Secondary | ICD-10-CM | POA: Diagnosis present

## 2019-05-16 DIAGNOSIS — Z20828 Contact with and (suspected) exposure to other viral communicable diseases: Secondary | ICD-10-CM | POA: Diagnosis present

## 2019-05-16 DIAGNOSIS — Z8042 Family history of malignant neoplasm of prostate: Secondary | ICD-10-CM

## 2019-05-16 DIAGNOSIS — Z820 Family history of epilepsy and other diseases of the nervous system: Secondary | ICD-10-CM | POA: Diagnosis not present

## 2019-05-16 DIAGNOSIS — E876 Hypokalemia: Secondary | ICD-10-CM | POA: Diagnosis present

## 2019-05-16 DIAGNOSIS — Z8673 Personal history of transient ischemic attack (TIA), and cerebral infarction without residual deficits: Secondary | ICD-10-CM | POA: Diagnosis not present

## 2019-05-16 DIAGNOSIS — I4891 Unspecified atrial fibrillation: Secondary | ICD-10-CM | POA: Diagnosis present

## 2019-05-16 DIAGNOSIS — Z8249 Family history of ischemic heart disease and other diseases of the circulatory system: Secondary | ICD-10-CM

## 2019-05-16 DIAGNOSIS — Z85118 Personal history of other malignant neoplasm of bronchus and lung: Secondary | ICD-10-CM | POA: Diagnosis not present

## 2019-05-16 DIAGNOSIS — Z9221 Personal history of antineoplastic chemotherapy: Secondary | ICD-10-CM | POA: Diagnosis not present

## 2019-05-16 DIAGNOSIS — Z87891 Personal history of nicotine dependence: Secondary | ICD-10-CM

## 2019-05-16 DIAGNOSIS — E785 Hyperlipidemia, unspecified: Secondary | ICD-10-CM | POA: Diagnosis present

## 2019-05-16 LAB — BASIC METABOLIC PANEL
Anion gap: 14 (ref 5–15)
BUN: 30 mg/dL — ABNORMAL HIGH (ref 8–23)
CO2: 25 mmol/L (ref 22–32)
Calcium: 10.2 mg/dL (ref 8.9–10.3)
Chloride: 102 mmol/L (ref 98–111)
Creatinine, Ser: 1.62 mg/dL — ABNORMAL HIGH (ref 0.61–1.24)
GFR calc Af Amer: 45 mL/min — ABNORMAL LOW (ref >60)
GFR calc non Af Amer: 39 mL/min — ABNORMAL LOW (ref >60)
Glucose, Bld: 131 mg/dL — ABNORMAL HIGH (ref 70–99)
Potassium: 3.2 mmol/L — ABNORMAL LOW (ref 3.5–5.1)
Sodium: 141 mmol/L (ref 135–145)

## 2019-05-16 LAB — CBC
HCT: 41.3 % (ref 39.0–52.0)
Hemoglobin: 13.5 g/dL (ref 13.0–17.0)
MCH: 27.5 pg (ref 26.0–34.0)
MCHC: 32.7 g/dL (ref 30.0–36.0)
MCV: 84.1 fL (ref 80.0–100.0)
Platelets: 309 10*3/uL (ref 150–400)
RBC: 4.91 MIL/uL (ref 4.22–5.81)
RDW: 14.6 % (ref 11.5–15.5)
WBC: 9.2 10*3/uL (ref 4.0–10.5)
nRBC: 0 % (ref 0.0–0.2)

## 2019-05-16 LAB — TROPONIN I (HIGH SENSITIVITY)
Troponin I (High Sensitivity): 28 ng/L — ABNORMAL HIGH (ref <18)
Troponin I (High Sensitivity): 26 ng/L — ABNORMAL HIGH (ref <18)
Troponin I (High Sensitivity): 22 ng/L — ABNORMAL HIGH (ref <18)

## 2019-05-16 LAB — SARS CORONAVIRUS 2 BY RT PCR (HOSPITAL ORDER, PERFORMED IN ~~LOC~~ HOSPITAL LAB): SARS Coronavirus 2: NEGATIVE

## 2019-05-16 LAB — BRAIN NATRIURETIC PEPTIDE
B Natriuretic Peptide: 298 pg/mL — ABNORMAL HIGH (ref 0.0–100.0)
B Natriuretic Peptide: 233 pg/mL — ABNORMAL HIGH (ref 0.0–100.0)

## 2019-05-16 LAB — T4, FREE: Free T4: 0.87 ng/dL (ref 0.61–1.12)

## 2019-05-16 MED ORDER — DILTIAZEM HCL 100 MG IV SOLR
5.0000 mg/h | INTRAVENOUS | Status: DC
  Administered 2019-05-16 – 2019-05-18 (×3): 5 mg/h via INTRAVENOUS
  Filled 2019-05-16 (×4): qty 100

## 2019-05-16 MED ORDER — ENOXAPARIN SODIUM 100 MG/ML ~~LOC~~ SOLN
1.0000 mg/kg | Freq: Two times a day (BID) | SUBCUTANEOUS | Status: DC
  Administered 2019-05-16 – 2019-05-20 (×8): 85 mg via SUBCUTANEOUS
  Filled 2019-05-16 (×9): qty 1

## 2019-05-16 MED ORDER — ONDANSETRON HCL 4 MG/2ML IJ SOLN: 4.0000 mg | Freq: Four times a day (QID) | INTRAMUSCULAR | Status: DC | PRN

## 2019-05-16 MED ORDER — POTASSIUM CHLORIDE 20 MEQ PO PACK
20.0000 meq | PACK | Freq: Once | ORAL | Status: AC
  Administered 2019-05-16: 20 meq via ORAL
  Filled 2019-05-16: qty 1

## 2019-05-16 MED ORDER — SODIUM CHLORIDE 0.9 % IV BOLUS
500.0000 mL | Freq: Once | INTRAVENOUS | Status: AC
  Administered 2019-05-16: 500 mL via INTRAVENOUS

## 2019-05-16 MED ORDER — SODIUM CHLORIDE 0.9% FLUSH
3.0000 mL | Freq: Once | INTRAVENOUS | Status: AC
  Administered 2019-05-16: 3 mL via INTRAVENOUS

## 2019-05-16 MED ORDER — ACETAMINOPHEN 325 MG PO TABS: 650.0000 mg | ORAL_TABLET | ORAL | Status: DC | PRN

## 2019-05-16 MED ORDER — ASPIRIN EC 81 MG PO TBEC
81.0000 mg | DELAYED_RELEASE_TABLET | Freq: Every day | ORAL | Status: DC
  Administered 2019-05-16 – 2019-05-20 (×5): 81 mg via ORAL
  Filled 2019-05-16 (×5): qty 1

## 2019-05-16 MED ORDER — DILTIAZEM HCL 100 MG IV SOLR
5.0000 mg/h | INTRAVENOUS | Status: DC
  Administered 2019-05-16: 5 mg/h via INTRAVENOUS
  Filled 2019-05-16: qty 100

## 2019-05-16 MED ORDER — SODIUM CHLORIDE 0.9 % IV SOLN
1.0000 g | INTRAVENOUS | Status: DC
  Administered 2019-05-16 – 2019-05-18 (×3): 1 g via INTRAVENOUS
  Filled 2019-05-16 (×4): qty 10
  Filled 2019-05-16: qty 1

## 2019-05-16 NOTE — Progress Notes (Signed)
ANTICOAGULATION CONSULT NOTE - Initial Consult  Pharmacy Consult for Lovenox Indication: atrial fibrillation  Allergies  Allergen Reactions  . Statins Other (See Comments)    Other reaction(s): Muscle Pain Other reaction(s): Muscle Pain    Patient Measurements: Weight: 188 lb 7.9 oz (85.5 kg) Heparin Dosing Weight:    Vital Signs: Temp: 97.7 F (36.5 C) (08/03 1629) Temp Source: Oral (08/03 1629) BP: 121/62 (08/03 1900) Pulse Rate: 66 (08/03 1900)  Labs: Recent Labs    05/16/19 1637  HGB 13.5  HCT 41.3  PLT 309  CREATININE 1.62*  TROPONINIHS 28*    Estimated Creatinine Clearance: 37.1 mL/min (A) (by C-G formula based on SCr of 1.62 mg/dL (H)).   Medical History: Past Medical History:  Diagnosis Date  . Amnesia, global, transient 08/20/2015   Overview:  Better at [facility], mri negative. On lipitor and plavix. Only issue is insomnia now.    . Arthritis   . Benign prostatic hyperplasia with urinary obstruction 11/06/2012  . CAD (coronary artery disease)   . Cancer of right lung ([place])    Right Upper lobe resection.   . Cancer of skin, squamous cell   . Elevated prostate specific antigen (PSA) 07/05/2012  . Enlarged prostate   . Hearing aid worn    both ears  . History of lung cancer 2001   Rt upper lobe; s/p chemo and radiation with recurrence  . HLD (hyperlipidemia)   . Hypertension   . Lumbar canal stenosis 01/17/2013  . Mild cognitive disorder 07/22/2014   Last Assessment & Plan:  Memory seems to be stable   . Nocturia associated with benign prostatic hypertrophy   . Pure hypercholesterolemia 07/22/2014   Last Assessment & Plan:  Has been working on a low fat diet and no myalgia's are present.    Marland Kitchen Spinal stenosis, lumbar region, with neurogenic claudication 01/17/2013  . TIA (transient ischemic attack) 08/08/2015  . Vasomotor rhinitis 08/08/2014    Medications:  Scheduled:  . aspirin EC  81 mg Oral Daily  . enoxaparin (LOVENOX) injection  1 mg/kg  Subcutaneous Q12H  . potassium chloride  20 mEq Oral Once   Infusions:  . cefTRIAXone (ROCEPHIN)  IV    . diltiazem (CARDIZEM) infusion 15 mg/hr (05/16/19 1735)  . diltiazem (CARDIZEM) infusion      Assessment: 84 yo M to start Lovenox for Afib w/ RVR. Home meds: plavix/ASA. Hgb 13.5  Plt 309 Scr 1.62 Crcl 37  Goal of Therapy:  Monitor platelets by anticoagulation protocol: Yes   Plan:  Will order Lovenox 1 mg/kg q12h (85 kg) for treatment dose for Atrial Fibrillation. Will f/u Scr and CBC per protocol  Remon Quinto A 05/16/2019,7:18 PM

## 2019-05-16 NOTE — ED Notes (Signed)
Pt attempting to provide urine sample now.

## 2019-05-16 NOTE — ED Notes (Signed)
Pt resting comfortably in bed. Denies any needs. Rails up. Bed locked low. Call bell within reach.

## 2019-05-16 NOTE — ED Notes (Signed)
Pt placed on 2L O2 as he was satting 93% RA with steady waveform.

## 2019-05-16 NOTE — ED Provider Notes (Signed)
[facility] Emergency Department Provider Note  ____________________________________________   I have reviewed the triage vital signs and the nursing notes. Where available I have reviewed prior notes and, if possible and indicated, outside hospital notes.   Patient seen and evaluated during the coronavirus epidemic during a time with low staffing  Patient seen for the symptoms described in the history of present illness. She was evaluated in the context of the global COVID-19 pandemic, which necessitated consideration that the patient might be at risk for infection with the SARS-CoV-2 virus that causes COVID-19. Institutional protocols and algorithms that pertain to the evaluation of patients at risk for COVID-19 are in a state of rapid change based on information released by regulatory bodies including the CDC and federal and state organizations. These policies and algorithms were followed during the patient's care in the ED.    HISTORY  Chief Complaint Tachycardia    HPI Roberto Adams is a 84 y.o. male  With a history of amnesia arthritis, CAD, HOH, but no listed atrial fibrillation that I can find presents today with rapid heartbeat from home.  No chest pain or shortness of breath.  Very unclear how long it is been going on.  I did try calling the spouse's phone number and no one answered.  Limited history.  Level 5 chart caveat; no further history available due to patient status.  Past Medical History:  Diagnosis Date  . Amnesia, global, transient 08/20/2015   Overview:  Better at [facility], mri negative. On lipitor and plavix. Only issue is insomnia now.    . Arthritis   . Benign prostatic hyperplasia with urinary obstruction 11/06/2012  . CAD (coronary artery disease)   . Cancer of right lung ([place])    Right Upper lobe resection.   . Cancer of skin, squamous cell   . Elevated prostate specific antigen (PSA) 07/05/2012  . Enlarged prostate   . Hearing aid worn     both ears  . History of lung cancer 2001   Rt upper lobe; s/p chemo and radiation with recurrence  . HLD (hyperlipidemia)   . Hypertension   . Lumbar canal stenosis 01/17/2013  . Mild cognitive disorder 07/22/2014   Last Assessment & Plan:  Memory seems to be stable   . Nocturia associated with benign prostatic hypertrophy   . Pure hypercholesterolemia 07/22/2014   Last Assessment & Plan:  Has been working on a low fat diet and no myalgia's are present.    Marland Kitchen Spinal stenosis, lumbar region, with neurogenic claudication 01/17/2013  . TIA (transient ischemic attack) 08/08/2015  . Vasomotor rhinitis 08/08/2014    Patient Active Problem List   Diagnosis Date Noted  . Stroke-like episode ([place]) 05/07/2019  . Gross hematuria 12/07/2015  . Amnesia, global, transient 08/20/2015  . HTN (hypertension) 08/08/2015  . CAD (coronary artery disease) 08/08/2015  . HLD (hyperlipidemia) 08/08/2015  . Dietary vitamin B12 deficiency anemia 08/08/2014  . Vasomotor rhinitis 08/08/2014  . Mild cognitive disorder 07/22/2014  . Essential (primary) hypertension 07/22/2014  . H/O malignant neoplasm 07/22/2014  . Pure hypercholesterolemia 07/22/2014  . Spinal stenosis, lumbar region, with neurogenic claudication 01/17/2013  . Lumbar canal stenosis 01/17/2013  . Benign prostatic hyperplasia with urinary obstruction 11/06/2012  . Elevated prostate specific antigen (PSA) 07/05/2012    Past Surgical History:  Procedure Laterality Date  . EPIDURAL BLOCK INJECTION    . LUMBAR LAMINECTOMY/DECOMPRESSION MICRODISCECTOMY Right 01/17/2013   Procedure: Right Lumbar Two-three,Lumbar three-four,Lumbar Four-Five Decompressive Lumbar laminectomy;  Surgeon: Charlie Pitter, MD;  Location: [facility];  Service: Neurosurgery;  Laterality: Right;  right  . LUNG CANCER SURGERY Right 2001  . PILONIDAL CYST / SINUS EXCISION  1958  . ROTATOR CUFF REPAIR  [phone]    Prior to Admission medications   Medication Sig Start Date  End Date Taking? Authorizing Provider  aspirin 81 MG tablet Take 81 mg by mouth daily.    [provider]  clopidogrel (PLAVIX) 75 MG tablet Take 1 tablet (75 mg total) by mouth daily. 08/09/15   Gladstone Lighter, MD  finasteride (PROSCAR) 5 MG tablet Take 1 tablet by mouth once daily 04/18/19   McGowan, Larene Beach A, PA-C  lisinopril-hydrochlorothiazide (PRINZIDE,ZESTORETIC) 20-25 MG tablet Take 1 tablet by mouth daily.  09/03/15   [provider]  tamsulosin (FLOMAX) 0.4 MG CAPS capsule Take 1 capsule (0.4 mg total) by mouth daily. 04/20/19   Zara Council A, PA-C  vitamin B-12 (CYANOCOBALAMIN) 1000 MCG tablet Take 1,000 mcg by mouth daily.    [provider]    Allergies Statins  Family History  Problem Relation Age of Onset  . Alzheimer's disease Mother   . Alcohol abuse Father   . Heart disease Father   . Prostate cancer Brother   . Kidney disease Neg Hx   . Kidney cancer Neg Hx   . Bladder Cancer Neg Hx     Social History Social History   Tobacco Use  . Smoking status: Former Smoker    Packs/day: 1.00    Years: 35.00    Pack years: 35.00    Types: Cigarettes  . Smokeless tobacco: Never Used  . Tobacco comment: quit smoking 1992  Substance Use Topics  . Alcohol use: No  . Drug use: No    Review of Systems Constitutional: No fever/chills Eyes: No visual changes. ENT: No sore throat. No stiff neck no neck pain Cardiovascular: Denies chest pain. Respiratory: Denies shortness of breath. Gastrointestinal:   no vomiting.  No diarrhea.  No constipation. Genitourinary: Negative for dysuria. Musculoskeletal: Negative lower extremity swelling Skin: Negative for rash. Neurological: Negative for severe headaches, focal weakness or numbness.   ____________________________________________   PHYSICAL EXAM:  VITAL SIGNS: ED Triage Vitals  Enc Vitals Group     BP 05/16/19 1629 (!) 97/52     Pulse Rate 05/16/19 1629 (!) 130     Resp 05/16/19  1629 15     Temp 05/16/19 1629 97.7 F (36.5 C)     Temp Source 05/16/19 1629 Oral     SpO2 05/16/19 1629 96 %     Weight 05/16/19 1627 188 lb 7.9 oz (85.5 kg)     Height --      Head Circumference --      Peak Flow --      Pain Score 05/16/19 1627 0     Pain Loc --      Pain Edu? --      Excl. in [place]? --     Constitutional: Vented to self and place unsure the exact date well appearing and in no acute distress. Eyes: Conjunctivae are normal Head: Atraumatic HEENT: No congestion/rhinnorhea. Mucous membranes are moist.  Oropharynx non-erythematous Neck:   Nontender with no meningismus, no masses, no stridor Cardiovascular: Irregularly irregular.  Good peripheral circulation. Respiratory: Normal respiratory effort.  No retractions. Lungs CTAB. Abdominal: Soft and nontender. No distention. No guarding no rebound Back:  There is no focal tenderness or step off.  there is no midline  tenderness there are no lesions noted. there is no CVA tenderness Musculoskeletal: No lower extremity tenderness, no upper extremity tenderness. No joint effusions, no DVT signs strong distal pulses no edema Neurologic:  Normal speech and language. No gross focal neurologic deficits are appreciated.  Skin:  Skin is warm, dry and intact. No rash noted. Psychiatric: Mood and affect are normal. Speech and behavior are normal.  ____________________________________________   LABS (all labs ordered are listed, but only abnormal results are displayed)  Labs Reviewed  CBC  BASIC METABOLIC PANEL  BRAIN NATRIURETIC PEPTIDE  TROPONIN I (HIGH SENSITIVITY)    Pertinent labs  results that were available during my care of the patient were reviewed by me and considered in my medical decision making (see chart for details). ____________________________________________  EKG  I personally interpreted any EKGs ordered by me or triage Atrial fibrillation rate 130 bpm, normal axis, RBB noted.  No acute ST  elevation ____________________________________________  RADIOLOGY  Pertinent labs & imaging results that were available during my care of the patient were reviewed by me and considered in my medical decision making (see chart for details). If possible, patient and/or family made aware of any abnormal findings.  Dg Chest 1 View  Result Date: 05/16/2019 CLINICAL DATA:  84 year old male with tachycardia. Negative for COVID-19. On 05/07/2019. EXAM: CHEST  1 VIEW COMPARISON:  Portable chest 05/08/2019 and earlier. FINDINGS: Portable AP upright view at 1642 hours. Lower lung volumes compared to 2017 and earlier. Stable cardiac size and mediastinal contours. No pneumothorax or pleural effusion. Improved perihilar ventilation since 05/08/2019. There is residual asymmetric left lung base opacity. No other confluent pulmonary opacity. No pulmonary edema suspected. Negative visible bowel gas pattern. No acute osseous abnormality identified. IMPRESSION: Improved but not resolved pulmonary opacity since 05/08/2019, with residual in the left lower lung. Consider persistent pulmonary infection. No pleural effusion is evident. Electronically Signed   By: Genevie Ann M.D.   On: 05/16/2019 17:02   ____________________________________________    PROCEDURES  Procedure(s) performed: None  Procedures  Critical Care performed: None  ____________________________________________   INITIAL IMPRESSION / ASSESSMENT AND PLAN / ED COURSE  Pertinent labs & imaging results that were available during my care of the patient were reviewed by me and considered in my medical decision making (see chart for details).   Patient here for elevated heart rate, he is asymptomatic per his report.  His EKG does show A. fib with RVR we will start him on a Cardizem drip without bolus given his pressure being slightly low, will get chest x-ray, BNP, and reassess.   ____________________________________________   FINAL CLINICAL  IMPRESSION(S) / ED DIAGNOSES  Final diagnoses:  Tachycardia      This chart was dictated using voice recognition software.  Despite best efforts to proofread,  errors can occur which can change meaning.      Schuyler Amor, MD 05/16/19 [phone]

## 2019-05-16 NOTE — ED Notes (Addendum)
Will continue to monitor BP/HR closely; if BP drops too low, will stop cardizem again. Pt denies taking any heart medications and denies history of Afib before today.

## 2019-05-16 NOTE — ED Notes (Signed)
This RN will call ICU again in 56mins.

## 2019-05-16 NOTE — ED Notes (Signed)
Urine sample sent to lab

## 2019-05-16 NOTE — ED Notes (Signed)
Pt given more warm blankets.

## 2019-05-16 NOTE — ED Notes (Signed)
Pt continues to rest. Denies needs. Bed locked low. Rails up. Call bell within reach.

## 2019-05-16 NOTE — ED Notes (Signed)
Provider Seals notified of continued low BP. Awaiting orders.

## 2019-05-16 NOTE — ED Notes (Addendum)
Cardizem stopped d/t drop in BP and HR. EDP McShane at bedside. Pt remains in Afib. No new orders.

## 2019-05-16 NOTE — ED Notes (Signed)
ED TO INPATIENT HANDOFF REPORT  ED Nurse Name and Phone #: Metta Clines [phone]  S Name/Age/Gender Roberto Adams 84 y.o. male Room/Bed: ED26A/ED26A  Code Status   Code Status: Full Code  Home/SNF/Other Home Patient oriented to: self, place, time and situation Is this baseline? Yes   Triage Complete: Triage complete  Chief Complaint Chest Pain  Triage Note Per Report from First Nurse, patient sent to ED for evaluation of tachycardia.  Patient denies all complaint in Triage.   Allergies Allergies  Allergen Reactions  . Statins Other (See Comments)    Other reaction(s): Muscle Pain Other reaction(s): Muscle Pain    Level of Care/Admitting Diagnosis ED Disposition    ED Disposition Condition [facility] Area: [place] [[id number]]  Level of Care: Telemetry [5]  Covid Evaluation: Asymptomatic Screening Protocol (No Symptoms)  Diagnosis: Atrial fibrillation with rapid ventricular response [facility]) [[id number]]  Admitting Physician: Mayer Camel [[id number]]  Attending Physician: Mayer Camel [[id number]]  Estimated length of stay: past midnight tomorrow  Certification:: I certify this patient will need inpatient services for at least 2 midnights  PT Class (Do Not Modify): Inpatient [101]  PT Acc Code (Do Not Modify): Private [1]       B Medical/Surgery History Past Medical History:  Diagnosis Date  . Amnesia, global, transient 08/20/2015   Overview:  Better at [facility], mri negative. On lipitor and plavix. Only issue is insomnia now.    . Arthritis   . Benign prostatic hyperplasia with urinary obstruction 11/06/2012  . CAD (coronary artery disease)   . Cancer of right lung ([place])    Right Upper lobe resection.   . Cancer of skin, squamous cell   . Elevated prostate specific antigen (PSA) 07/05/2012  . Enlarged prostate   . Hearing aid worn    both ears  . History of lung cancer 2001   Rt upper lobe; s/p chemo and radiation with recurrence   . HLD (hyperlipidemia)   . Hypertension   . Lumbar canal stenosis 01/17/2013  . Mild cognitive disorder 07/22/2014   Last Assessment & Plan:  Memory seems to be stable   . Nocturia associated with benign prostatic hypertrophy   . Pure hypercholesterolemia 07/22/2014   Last Assessment & Plan:  Has been working on a low fat diet and no myalgia's are present.    Marland Kitchen Spinal stenosis, lumbar region, with neurogenic claudication 01/17/2013  . TIA (transient ischemic attack) 08/08/2015  . Vasomotor rhinitis 08/08/2014   Past Surgical History:  Procedure Laterality Date  . EPIDURAL BLOCK INJECTION    . LUMBAR LAMINECTOMY/DECOMPRESSION MICRODISCECTOMY Right 01/17/2013   Procedure: Right Lumbar Two-three,Lumbar three-four,Lumbar Four-Five Decompressive Lumbar laminectomy;  Surgeon: Charlie Pitter, MD;  Location: [place] [facility] OR[facility];  Service: Neurosurgery;  Laterality: Right;  right  . LUNG CANCER SURGERY Right 2001  . PILONIDAL CYST / SINUS EXCISION  1958  . ROTATOR CUFF REPAIR  [phone]     A IV Location/Drains/Wounds Patient Lines/Drains/Airways Status   Active Line/Drains/Airways    Name:   Placement date:   Placement time:   Site:   Days:   Peripheral IV 05/16/19 Right Forearm   05/16/19    1636    Forearm   less than 1   Peripheral IV 05/16/19 Left Antecubital   05/16/19    1937    Antecubital   less than 1          Intake/Output Last 24 hours  Intake/Output Summary (  Last 24 hours) at 05/16/2019 2028 Last data filed at 05/16/2019 1945 Gross per 24 hour  Intake 500 ml  Output -  Net 500 ml    Labs/Imaging Results for orders placed or performed during the hospital encounter of 05/16/19 (from the past 48 hour(s))  Basic metabolic panel     Status: Abnormal   Collection Time: 05/16/19  4:37 PM  Result Value Ref Range   Sodium 141 135 - 145 mmol/L   Potassium 3.2 (L) 3.5 - 5.1 mmol/L   Chloride 102 98 - 111 mmol/L   CO2 25 22 - 32 mmol/L   Glucose, Bld 131 (H) 70 - 99 mg/dL   BUN 30 (H) 8  - 23 mg/dL   Creatinine, Ser 1.62 (H) 0.61 - 1.24 mg/dL   Calcium 10.2 8.9 - 10.3 mg/dL   GFR calc non Af Amer 39 (L) >60 mL/min   GFR calc Af Amer 45 (L) >60 mL/min   Anion gap 14 5 - 15    Comment: Performed at [facility], [address] [place], [place] [address]  CBC     Status: None   Collection Time: 05/16/19  4:37 PM  Result Value Ref Range   WBC 9.2 4.0 - 10.5 K/uL   RBC 4.91 4.22 - 5.81 MIL/uL   Hemoglobin 13.5 13.0 - 17.0 g/dL   HCT 41.3 39.0 - 52.0 %   MCV 84.1 80.0 - 100.0 fL   MCH 27.5 26.0 - 34.0 pg   MCHC 32.7 30.0 - 36.0 g/dL   RDW 14.6 11.5 - 15.5 %   Platelets 309 150 - 400 K/uL   nRBC 0.0 0.0 - 0.2 %    Comment: Performed at [facility], [place], [place] [address]  Troponin I (High Sensitivity)     Status: Abnormal   Collection Time: 05/16/19  4:37 PM  Result Value Ref Range   Troponin I (High Sensitivity) 28 (H) <18 ng/L    Comment: (NOTE) Elevated high sensitivity troponin I (hsTnI) values and significant  changes across serial measurements may suggest ACS but many other  chronic and acute conditions are known to elevate hsTnI results.  Refer to the "Links" section for chest pain algorithms and additional  guidance. Performed at [facility], [address] [place], [place] [address]   Brain natriuretic peptide     Status: Abnormal   Collection Time: 05/16/19  4:37 PM  Result Value Ref Range   B Natriuretic Peptide 298.0 (H) 0.0 - 100.0 pg/mL    Comment: Performed at [facility], [address] [place], [place] [address]  SARS Coronavirus 2 [facility] order, Performed in [place] hospital lab)     Status: None   Collection Time: 05/16/19  5:52 PM  Result Value Ref Range   SARS Coronavirus 2 NEGATIVE NEGATIVE    Comment: (NOTE) If result is NEGATIVE SARS-CoV-2 target nucleic acids are NOT DETECTED. The SARS-CoV-2 RNA is generally detectable in upper and lower  respiratory specimens  during the acute phase of infection. The lowest  concentration of SARS-CoV-2 viral copies this assay can detect is 250  copies / mL. A negative result does not preclude SARS-CoV-2 infection  and should not be used as the sole basis for treatment or other  patient management decisions.  A negative result may occur with  improper specimen collection / handling, submission of specimen other  than nasopharyngeal swab, presence of viral mutation(s) within the  areas targeted by  this assay, and inadequate number of viral copies  (<250 copies / mL). A negative result must be combined with clinical  observations, patient history, and epidemiological information. If result is POSITIVE SARS-CoV-2 target nucleic acids are DETECTED. The SARS-CoV-2 RNA is generally detectable in upper and lower  respiratory specimens dur ing the acute phase of infection.  Positive  results are indicative of active infection with SARS-CoV-2.  Clinical  correlation with patient history and other diagnostic information is  necessary to determine patient infection status.  Positive results do  not rule out bacterial infection or co-infection with other viruses. If result is PRESUMPTIVE POSTIVE SARS-CoV-2 nucleic acids MAY BE PRESENT.   A presumptive positive result was obtained on the submitted specimen  and confirmed on repeat testing.  While 2019 novel coronavirus  (SARS-CoV-2) nucleic acids may be present in the submitted sample  additional confirmatory testing may be necessary for epidemiological  and / or clinical management purposes  to differentiate between  SARS-CoV-2 and other Sarbecovirus currently known to infect humans.  If clinically indicated additional testing with an alternate test  methodology [phone]) is advised. The SARS-CoV-2 RNA is generally  detectable in upper and lower respiratory sp ecimens during the acute  phase of infection. The expected result is Negative. Fact Sheet for Patients:   StrictlyIdeas.no Fact Sheet for Healthcare Providers: BankingDealers.co.za This test is not yet approved or cleared by the [place] FDA and has been authorized for detection and/or diagnosis of SARS-CoV-2 by FDA under an Emergency Use Authorization (EUA).  This EUA will remain in effect (meaning this test can be used) for the duration of the COVID-19 declaration under Section 564(b)(1) of the Act, 21 U.S.C. section 360bbb-3(b)(1), unless the authorization is terminated or revoked sooner. Performed at [facility], [address] [place], [place] [address]   Brain natriuretic peptide     Status: Abnormal   Collection Time: 05/16/19  7:39 PM  Result Value Ref Range   B Natriuretic Peptide 233.0 (H) 0.0 - 100.0 pg/mL    Comment: Performed at [facility], [address], [place], [place] [address]   Dg Chest 1 View  Result Date: 05/16/2019 CLINICAL DATA:  84 year old male with tachycardia. Negative for COVID-19. On 05/07/2019. EXAM: CHEST  1 VIEW COMPARISON:  Portable chest 05/08/2019 and earlier. FINDINGS: Portable AP upright view at 1642 hours. Lower lung volumes compared to 2017 and earlier. Stable cardiac size and mediastinal contours. No pneumothorax or pleural effusion. Improved perihilar ventilation since 05/08/2019. There is residual asymmetric left lung base opacity. No other confluent pulmonary opacity. No pulmonary edema suspected. Negative visible bowel gas pattern. No acute osseous abnormality identified. IMPRESSION: Improved but not resolved pulmonary opacity since 05/08/2019, with residual in the left lower lung. Consider persistent pulmonary infection. No pleural effusion is evident. Electronically Signed   By: Genevie Ann M.D.   On: 05/16/2019 17:02   Ct Head Wo Contrast  Result Date: 05/16/2019 CLINICAL DATA:  Frequent falls with confusion EXAM: CT HEAD WITHOUT CONTRAST TECHNIQUE: Contiguous axial images were  obtained from the base of the skull through the vertex without intravenous contrast. COMPARISON:  CT brain 05/07/2019, MRI 05/07/2019 FINDINGS: Brain: No acute territorial infarction, hemorrhage or intracranial mass. Moderate atrophy. Moderate small vessel ischemic changes of the white matter. Stable ventricle size. Vascular: No hyperdense vessels. Vertebral and carotid vascular calcification Skull: Normal. Negative for fracture or focal lesion. Sinuses/Orbits: No acute finding. Other: None IMPRESSION: 1. No CT evidence for acute intracranial abnormality.  2. Atrophy and small vessel ischemic changes of the white matter Electronically Signed   By: Donavan Foil M.D.   On: 05/16/2019 17:47    Pending Labs Unresulted Labs (From admission, onward)    Start     Ordered   05/17/19 5409  Basic metabolic panel  Tomorrow morning,   STAT     05/16/19 1907   05/17/19 0500  CBC  Tomorrow morning,   STAT     05/16/19 1907   05/16/19 1908  T4, free  Once,   STAT     05/16/19 1907   05/16/19 1754  Urine Culture  Add-on,   AD     05/16/19 1753          Vitals/Pain Today's Vitals   05/16/19 2015 05/16/19 2017 05/16/19 2019 05/16/19 2021  BP: (!) 89/55 (!) 94/55    Pulse: (!) 152 (!) 58 64 (!) 103  Resp: 14 16 20 19   Temp:      TempSrc:      SpO2: 98% 99% 98%   Weight:      PainSc:        Isolation Precautions No active isolations  Medications Medications  diltiazem (CARDIZEM) 100 mg in dextrose 5 % 100 mL (1 mg/mL) infusion (0 mg/hr Intravenous Stopped 05/16/19 2016)  acetaminophen (TYLENOL) tablet 650 mg (has no administration in time range)  ondansetron (ZOFRAN) injection 4 mg (has no administration in time range)  diltiazem (CARDIZEM) 100 mg in dextrose 5 % 100 mL (1 mg/mL) infusion (has no administration in time range)  aspirin EC tablet 81 mg (has no administration in time range)  potassium chloride (KLOR-CON) packet 20 mEq (has no administration in time range)  cefTRIAXone (ROCEPHIN) 1  g in sodium chloride 0.9 % 100 mL IVPB (0 g Intravenous Stopped 05/16/19 2016)  enoxaparin (LOVENOX) injection 85 mg (has no administration in time range)  sodium chloride flush (NS) 0.9 % injection 3 mL (3 mLs Intravenous Given 05/16/19 1702)  sodium chloride 0.9 % bolus 500 mL (0 mLs Intravenous Stopped 05/16/19 1945)    Mobility walks with device Low fall risk   Focused Assessments Cardiac Assessment Handoff:  Cardiac Rhythm: Atrial fibrillation Lab Results  Component Value Date   TROPONINI <0.03 08/08/2015   No results found for: DDIMER Does the Patient currently have chest pain? No     R Recommendations: See Admitting Provider Note  Report given to:   Additional Notes:  On 2L O2 via [place]; remains in Afib; cardizem stopped d/t substantial drop in BP and HR. Pt HOH; has hearing aids in.

## 2019-05-16 NOTE — H&P (Signed)
[facility] [place] NAME: Roberto Adams    MR#:  [id number]  DATE OF BIRTH:  Aug 14, 1935  DATE OF ADMISSION:  05/16/2019  PRIMARY CARE PHYSICIAN: Kirk Ruths, MD   REQUESTING/REFERRING PHYSICIAN: Charlotte Crumb, MD  CHIEF COMPLAINT:   Chief Complaint  Patient presents with  . Tachycardia    HISTORY OF PRESENT ILLNESS:  Roberto Adams  is a 84 y.o. male with a known history of right upper lobe lung cancer status post chemotherapy and radiation, hypertension, HLD, CAD, BPH.  Patient has a history of TIA.  He is on Plavix and aspirin.  He has no known history of atrial fibrillation that he is aware of or that appears in his electronic medical record.  He presented to the emergency room from home with palpitations.  He denies experiencing chest pain or shortness of breath.  He denies fevers, chills, nausea, vomiting, diarrhea.  He denies abdominal pain.  Palpitations have been present throughout the day.  Patient has severely decreased hearing acuity and is somewhat of a poor historian as well.  EKG on arrival to the emergency room demonstrated atrial fibrillation with RVR at a rate of 130 bpm.  Patient sees Dr. Saralyn Pilar outpatient according to electronic medical record with most recent echocardiogram on 05/08/2019 with 45 to 50% ejection fraction.  On arrival, troponin is 28 with potassium of 3.2.  Patient was initially treated with diltiazem in the emergency room with a rate improved into the upper 60s.  However patient continues to have abnormal rhythm of atrial fibrillation.  Urinalysis is being repeated.  However patient reports he continues to experience dysuria at times with dark urine.  He has been admitted to the hospitalist service for further management.  PAST MEDICAL HISTORY:   Past Medical History:  Diagnosis Date  . Amnesia, global, transient 08/20/2015   Overview:  Better at [facility], mri negative. On lipitor and plavix. Only  issue is insomnia now.    . Arthritis   . Benign prostatic hyperplasia with urinary obstruction 11/06/2012  . CAD (coronary artery disease)   . Cancer of right lung ([place])    Right Upper lobe resection.   . Cancer of skin, squamous cell   . Elevated prostate specific antigen (PSA) 07/05/2012  . Enlarged prostate   . Hearing aid worn    both ears  . History of lung cancer 2001   Rt upper lobe; s/p chemo and radiation with recurrence  . HLD (hyperlipidemia)   . Hypertension   . Lumbar canal stenosis 01/17/2013  . Mild cognitive disorder 07/22/2014   Last Assessment & Plan:  Memory seems to be stable   . Nocturia associated with benign prostatic hypertrophy   . Pure hypercholesterolemia 07/22/2014   Last Assessment & Plan:  Has been working on a low fat diet and no myalgia's are present.    Marland Kitchen Spinal stenosis, lumbar region, with neurogenic claudication 01/17/2013  . TIA (transient ischemic attack) 08/08/2015  . Vasomotor rhinitis 08/08/2014    PAST SURGICAL HISTORY:   Past Surgical History:  Procedure Laterality Date  . EPIDURAL BLOCK INJECTION    . LUMBAR LAMINECTOMY/DECOMPRESSION MICRODISCECTOMY Right 01/17/2013   Procedure: Right Lumbar Two-three,Lumbar three-four,Lumbar Four-Five Decompressive Lumbar laminectomy;  Surgeon: Charlie Pitter, MD;  Location: [place] [facility];  Service: Neurosurgery;  Laterality: Right;  right  . LUNG CANCER SURGERY Right 2001  . PILONIDAL CYST / SINUS EXCISION  1958  . ROTATOR CUFF REPAIR  [phone]    SOCIAL HISTORY:   Social History   Tobacco Use  . Smoking status: Former Smoker    Packs/day: 1.00    Years: 35.00    Pack years: 35.00    Types: Cigarettes  . Smokeless tobacco: Never Used  . Tobacco comment: quit smoking 1992  Substance Use Topics  . Alcohol use: No    FAMILY HISTORY:   Family History  Problem Relation Age of Onset  . Alzheimer's disease Mother   . Alcohol abuse Father   . Heart disease Father   . Prostate cancer Brother    . Kidney disease Neg Hx   . Kidney cancer Neg Hx   . Bladder Cancer Neg Hx     DRUG ALLERGIES:   Allergies  Allergen Reactions  . Statins Other (See Comments)    Other reaction(s): Muscle Pain Other reaction(s): Muscle Pain    REVIEW OF SYSTEMS:   Review of Systems  Constitutional: Negative for chills, fever and malaise/fatigue.  HENT: Negative for congestion, sinus pain and sore throat.   Eyes: Negative for blurred vision and double vision.  Respiratory: Negative for cough, sputum production, shortness of breath and wheezing.   Cardiovascular: Positive for palpitations. Negative for chest pain and leg swelling.  Gastrointestinal: Negative for abdominal pain, blood in stool, constipation, diarrhea, heartburn, nausea and vomiting.  Genitourinary: Negative for dysuria, flank pain, frequency and hematuria.  Musculoskeletal: Negative for falls and myalgias.  Neurological: Negative for dizziness, tingling, focal weakness and headaches.  Psychiatric/Behavioral: Negative.  Negative for depression.   MEDICATIONS AT HOME:   Prior to Admission medications   Medication Sig Start Date End Date Taking? Authorizing Provider  aspirin 81 MG tablet Take 81 mg by mouth daily.   Yes [provider]  Cholecalciferol (VITAMIN D-1000 MAX ST) 25 MCG (1000 UT) tablet Take 1,000 mg by mouth daily.   Yes [provider]  clopidogrel (PLAVIX) 75 MG tablet Take 1 tablet (75 mg total) by mouth daily. 08/09/15  Yes Gladstone Lighter, MD  finasteride (PROSCAR) 5 MG tablet Take 1 tablet by mouth once daily 04/18/19  Yes McGowan, Larene Beach A, PA-C  lisinopril-hydrochlorothiazide (ZESTORETIC) 10-12.5 MG tablet Take 1 tablet by mouth daily.  09/03/15  Yes [provider]  tamsulosin (FLOMAX) 0.4 MG CAPS capsule Take 1 capsule (0.4 mg total) by mouth daily. 04/20/19  Yes McGowan, Larene Beach A, PA-C  vitamin B-12 (CYANOCOBALAMIN) 1000 MCG tablet Take 1,000 mcg by mouth daily.   Yes [provider]      VITAL SIGNS:  Blood pressure 120/62, pulse 84, temperature 97.7 F (36.5 C), temperature source Oral, resp. rate 17, weight 85.5 kg, SpO2 96 %.  PHYSICAL EXAMINATION:  Physical Exam  GENERAL:  84 y.o.-year-old patient lying in the bed with no acute distress.  EYES: Pupils equal, round, reactive to light and accommodation. No scleral icterus. Extraocular muscles intact.  HEENT: Head atraumatic, normocephalic. Oropharynx and nasopharynx clear.  NECK:  Supple, no jugular venous distention. No thyroid enlargement, no tenderness.  LUNGS: Normal breath sounds bilaterally, no wheezing, rales,rhonchi or crepitation. No use of accessory muscles of respiration.  CARDIOVASCULAR: Irregular rate and irregular rhythm, S1, S2 normal. No murmurs, rubs, or gallops.  ABDOMEN: Soft, nondistended, nontender. Bowel sounds present. No organomegaly or mass.  EXTREMITIES: No pedal edema, cyanosis, or clubbing.  NEUROLOGIC: Decreased hearing acuity and generalized weakness. Sensation intact. Gait not checked.  PSYCHIATRIC: The patient is alert and oriented x 3.  Normal affect and good eye  contact. SKIN: No obvious rash, lesion, or ulcer.   LABORATORY PANEL:   CBC Recent Labs  Lab 05/16/19 1637  WBC 9.2  HGB 13.5  HCT 41.3  PLT 309   ------------------------------------------------------------------------------------------------------------------  Chemistries  Recent Labs  Lab 05/16/19 1637  NA 141  K 3.2*  CL 102  CO2 25  GLUCOSE 131*  BUN 30*  CREATININE 1.62*  CALCIUM 10.2   ------------------------------------------------------------------------------------------------------------------  Cardiac Enzymes No results for input(s): TROPONINI in the last 168 hours. ------------------------------------------------------------------------------------------------------------------  RADIOLOGY:  Dg Chest 1 View  Result Date: 05/16/2019 CLINICAL DATA:  84 year old male  with tachycardia. Negative for COVID-19. On 05/07/2019. EXAM: CHEST  1 VIEW COMPARISON:  Portable chest 05/08/2019 and earlier. FINDINGS: Portable AP upright view at 1642 hours. Lower lung volumes compared to 2017 and earlier. Stable cardiac size and mediastinal contours. No pneumothorax or pleural effusion. Improved perihilar ventilation since 05/08/2019. There is residual asymmetric left lung base opacity. No other confluent pulmonary opacity. No pulmonary edema suspected. Negative visible bowel gas pattern. No acute osseous abnormality identified. IMPRESSION: Improved but not resolved pulmonary opacity since 05/08/2019, with residual in the left lower lung. Consider persistent pulmonary infection. No pleural effusion is evident. Electronically Signed   By: Genevie Ann M.D.   On: 05/16/2019 17:02   Ct Head Wo Contrast  Result Date: 05/16/2019 CLINICAL DATA:  Frequent falls with confusion EXAM: CT HEAD WITHOUT CONTRAST TECHNIQUE: Contiguous axial images were obtained from the base of the skull through the vertex without intravenous contrast. COMPARISON:  CT brain 05/07/2019, MRI 05/07/2019 FINDINGS: Brain: No acute territorial infarction, hemorrhage or intracranial mass. Moderate atrophy. Moderate small vessel ischemic changes of the white matter. Stable ventricle size. Vascular: No hyperdense vessels. Vertebral and carotid vascular calcification Skull: Normal. Negative for fracture or focal lesion. Sinuses/Orbits: No acute finding. Other: None IMPRESSION: 1. No CT evidence for acute intracranial abnormality. 2. Atrophy and small vessel ischemic changes of the white matter Electronically Signed   By: Donavan Foil M.D.   On: 05/16/2019 17:47      IMPRESSION AND PLAN:   1.  Atrial fibrillation with RVR - Diltiazem infusion - Telemetry monitoring - Lovenox subcu - Cardiology consulted with Dr. Ubaldo Glassing - Results of echocardiogram on 7/26 reviewed with 45 to 50% ejection fraction - Electrolyte replacement  -Continue to trend troponin levels -Repeat EKG in the a.m. -Supplemental oxygen.  2.  Urinary tract infection - IV Rocephin -Review urine culture results when available and adjust therapy accordingly  3.  Hypertension - Zestoretic continued -We will treat persistent hypertension expectantly  4.  BPH - Tamsulosin continued  DVT and PPI prophylaxis    All the records are reviewed and case discussed with ED provider. The plan of care was discussed in details with the patient (and family). I answered all questions. The patient agreed to proceed with the above mentioned plan. Further management will depend upon hospital course.   CODE STATUS: Full code  TOTAL TIME TAKING CARE OF THIS PATIENT: 45 minutes.    [place] on 05/16/2019 at 6:00 PM  Pager - [phone]  After 6pm go to [email or web address] - Technical brewer [facility]  Office  [phone]  CC: Primary care physician; Kirk Ruths, MD   Note: This dictation was prepared with Dragon dictation along with smaller phrase technology. Any transcriptional errors that result from this process are unintentional.

## 2019-05-16 NOTE — ED Notes (Signed)
Attempted report again. Left name and ascom number with ICU secretary.

## 2019-05-16 NOTE — ED Notes (Signed)
Attempted report to [facility] in ICU.

## 2019-05-16 NOTE — ED Triage Notes (Signed)
Per Report from First Nurse, patient sent to ED for evaluation of tachycardia.  Patient denies all complaint in Triage.

## 2019-05-16 NOTE — ED Notes (Signed)
Provider Seals notified that BP inc to WDL again; verbal to hold 250 bolus.

## 2019-05-17 LAB — BASIC METABOLIC PANEL
Anion gap: 10 (ref 5–15)
BUN: 26 mg/dL — ABNORMAL HIGH (ref 8–23)
CO2: 25 mmol/L (ref 22–32)
Calcium: 9.6 mg/dL (ref 8.9–10.3)
Chloride: 106 mmol/L (ref 98–111)
Creatinine, Ser: 1.06 mg/dL (ref 0.61–1.24)
GFR calc Af Amer: >60 mL/min (ref >60)
GFR calc non Af Amer: >60 mL/min (ref >60)
Glucose, Bld: 90 mg/dL (ref 70–99)
Potassium: 3.2 mmol/L — ABNORMAL LOW (ref 3.5–5.1)
Sodium: 141 mmol/L (ref 135–145)

## 2019-05-17 LAB — CBC
HCT: 38.1 % — ABNORMAL LOW (ref 39.0–52.0)
Hemoglobin: 12.4 g/dL — ABNORMAL LOW (ref 13.0–17.0)
MCH: 27.8 pg (ref 26.0–34.0)
MCHC: 32.5 g/dL (ref 30.0–36.0)
MCV: 85.4 fL (ref 80.0–100.0)
Platelets: 258 10*3/uL (ref 150–400)
RBC: 4.46 MIL/uL (ref 4.22–5.81)
RDW: 14.5 % (ref 11.5–15.5)
WBC: 7.4 10*3/uL (ref 4.0–10.5)
nRBC: 0 % (ref 0.0–0.2)

## 2019-05-17 LAB — GLUCOSE, CAPILLARY: Glucose-Capillary: 116 mg/dL — ABNORMAL HIGH (ref 70–99)

## 2019-05-17 LAB — MRSA PCR SCREENING: MRSA by PCR: NEGATIVE

## 2019-05-17 MED ORDER — POTASSIUM CHLORIDE 20 MEQ PO PACK
40.0000 meq | PACK | Freq: Once | ORAL | Status: AC
  Administered 2019-05-17: 11:00:00 40 meq via ORAL
  Filled 2019-05-17: qty 2

## 2019-05-17 MED ORDER — CHLORHEXIDINE GLUCONATE CLOTH 2 % EX PADS
6.0000 | MEDICATED_PAD | Freq: Every day | CUTANEOUS | Status: DC
  Administered 2019-05-17 – 2019-05-21 (×4): 6 via TOPICAL

## 2019-05-17 NOTE — Consult Note (Signed)
[facility] Cardiology  CARDIOLOGY CONSULT NOTE  Patient ID: Roberto Adams MRN: [id number] DOB/AGE: 82-Jun-1936 82 y.o.  Admit date: 05/16/2019 Referring Physician Gardiner Barefoot, NP Primary Physician Dr. Ouida Sills Primary Cardiologist n/a Reason for Consultation Atrial fibrillation with rapid ventricular response  HPI: [name] is an 84 year old male with a past medical history significant for a TIA, right upper lobe lung cancer s/p chemo and radiation, and hyperlipidemia who presented to the ED on 05/16/19 and was noted to be in atrial fibrillation with rapid ventricular response.  He has no known history of atrial fibrillation, however, Holter monitor in 2016 revealed a probable episode of atrial fibrillation.  Workup in the ED was significant for potassium of 3.2, borderline elevated troponin x 3 of 28, 26, and 22 respectively, and head CT negative for acute intracranial abnormalities.  Mr. Darden appears to deny chest pain, palpitations, or shortness of breath, but due to underlying dementia, history is limited.   Holter monitor in 2016 revealed predominately sinus rhythm with an average heart rate of 90bpm.  There were occasional PACs and rare PVCs. There was one brief atrial run, probable atrial fibrillation, at 192 beats per minute.  Echocardiogram in 2016 revealed low normal LV systolic function with an EF estimated around 50% with evidence of grade 1 diastolic dysfunction.  He is not followed by cardiology in an outpatient setting.   Review of systems complete and found to be negative unless listed above     Past Medical History:  Diagnosis Date  . Amnesia, global, transient 08/20/2015   Overview:  Better at [facility], mri negative. On lipitor and plavix. Only issue is insomnia now.    . Arthritis   . Benign prostatic hyperplasia with urinary obstruction 11/06/2012  . CAD (coronary artery disease)   . Cancer of right lung ([place])    Right Upper lobe resection.   . Cancer of skin, squamous cell   .  Elevated prostate specific antigen (PSA) 07/05/2012  . Enlarged prostate   . Hearing aid worn    both ears  . History of lung cancer 2001   Rt upper lobe; s/p chemo and radiation with recurrence  . HLD (hyperlipidemia)   . Hypertension   . Lumbar canal stenosis 01/17/2013  . Mild cognitive disorder 07/22/2014   Last Assessment & Plan:  Memory seems to be stable   . Nocturia associated with benign prostatic hypertrophy   . Pure hypercholesterolemia 07/22/2014   Last Assessment & Plan:  Has been working on a low fat diet and no myalgia's are present.    Marland Kitchen Spinal stenosis, lumbar region, with neurogenic claudication 01/17/2013  . TIA (transient ischemic attack) 08/08/2015  . Vasomotor rhinitis 08/08/2014    Past Surgical History:  Procedure Laterality Date  . EPIDURAL BLOCK INJECTION    . LUMBAR LAMINECTOMY/DECOMPRESSION MICRODISCECTOMY Right 01/17/2013   Procedure: Right Lumbar Two-three,Lumbar three-four,Lumbar Four-Five Decompressive Lumbar laminectomy;  Surgeon: Charlie Pitter, MD;  Location: [facility];  Service: Neurosurgery;  Laterality: Right;  right  . LUNG CANCER SURGERY Right 2001  . PILONIDAL CYST / SINUS EXCISION  1958  . ROTATOR CUFF REPAIR  [id number]    Medications Prior to Admission  Medication Sig Dispense Refill Last Dose  . aspirin 81 MG tablet Take 81 mg by mouth daily.   05/16/2019 at 0900  . Cholecalciferol (VITAMIN D-1000 MAX ST) 25 MCG (1000 UT) tablet Take 1,000 mg by mouth daily.   05/16/2019 at Unknown time  . clopidogrel (PLAVIX)  75 MG tablet Take 1 tablet (75 mg total) by mouth daily. 90 tablet 0 05/16/2019 at 0900  . finasteride (PROSCAR) 5 MG tablet Take 1 tablet by mouth once daily 30 tablet 0 05/16/2019 at 0900  . lisinopril-hydrochlorothiazide (ZESTORETIC) 10-12.5 MG tablet Take 1 tablet by mouth daily.    05/16/2019 at 0900  . tamsulosin (FLOMAX) 0.4 MG CAPS capsule Take 1 capsule (0.4 mg total) by mouth daily. 90 capsule 3 05/16/2019 at 0900  . vitamin B-12  (CYANOCOBALAMIN) 1000 MCG tablet Take 1,000 mcg by mouth daily.   05/16/2019 at 0900   Social History   Socioeconomic History  . Marital status: Married    Spouse name: Not on file  . Number of children: Not on file  . Years of education: Not on file  . Highest education level: Not on file  Occupational History  . Not on file  Social Needs  . Financial resource strain: Not on file  . Food insecurity    Worry: Not on file    Inability: Not on file  . Transportation needs    Medical: Not on file    Non-medical: Not on file  Tobacco Use  . Smoking status: Former Smoker    Packs/day: 1.00    Years: 35.00    Pack years: 35.00    Types: Cigarettes  . Smokeless tobacco: Never Used  . Tobacco comment: quit smoking 1992  Substance and Sexual Activity  . Alcohol use: No  . Drug use: No  . Sexual activity: Not on file  Lifestyle  . Physical activity    Days per week: Not on file    Minutes per session: Not on file  . Stress: Not on file  Relationships  . Social Herbalist on phone: Not on file    Gets together: Not on file    Attends religious service: Not on file    Active member of club or organization: Not on file    Attends meetings of clubs or organizations: Not on file    Relationship status: Not on file  . Intimate partner violence    Fear of current or ex partner: Not on file    Emotionally abused: Not on file    Physically abused: Not on file    Forced sexual activity: Not on file  Other Topics Concern  . Not on file  Social History Narrative  . Not on file    Family History  Problem Relation Age of Onset  . Alzheimer's disease Mother   . Alcohol abuse Father   . Heart disease Father   . Prostate cancer Brother   . Kidney disease Neg Hx   . Kidney cancer Neg Hx   . Bladder Cancer Neg Hx       Review of systems complete and found to be negative unless listed above      PHYSICAL EXAM  General: Well developed, well nourished, in no acute  distress HEENT:  Normocephalic and atramatic Neck:  No JVD.  Lungs: Clear bilaterally to auscultation and percussion. Heart: Irregularly irregular, rate controlled.  3/6 systolic murmur appreciated.  No gallops or rubs  Abdomen: Bowel sounds are positive, abdomen soft and non-tender  Msk:  Back normal. Normal strength and tone for age. Extremities: No clubbing, cyanosis or edema.   Neuro: Oriented to place and time Psych:  Good affect, somewhat confused  Labs:   Lab Results  Component Value Date   WBC 7.4 05/17/2019  HGB 12.4 (L) 05/17/2019   HCT 38.1 (L) 05/17/2019   MCV 85.4 05/17/2019   PLT 258 05/17/2019    Recent Labs  Lab 05/17/19 0450  NA 141  K 3.2*  CL 106  CO2 25  BUN 26*  CREATININE 1.06  CALCIUM 9.6  GLUCOSE 90   Lab Results  Component Value Date   TROPONINI <0.03 08/08/2015    Lab Results  Component Value Date   CHOL 202 (H) 05/08/2019   CHOL 186 08/09/2015   Lab Results  Component Value Date   HDL 34 (L) 05/08/2019   HDL 32 (L) 08/09/2015   Lab Results  Component Value Date   LDLCALC 138 (H) 05/08/2019   LDLCALC 121 (H) 08/09/2015   Lab Results  Component Value Date   TRIG 148 05/08/2019   TRIG 166 (H) 08/09/2015   Lab Results  Component Value Date   CHOLHDL 5.9 05/08/2019   CHOLHDL 5.8 08/09/2015   No results found for: LDLDIRECT    Radiology: Ct Angio Head W Or Wo Contrast  Result Date: 05/07/2019 CLINICAL DATA:  Increased confusion. Dementia. Agitation is worse today. Stroke, follow-up. EXAM: CT ANGIOGRAPHY HEAD TECHNIQUE: Multidetector CT imaging of the head was performed using the standard protocol during bolus administration of intravenous contrast. Multiplanar CT image reconstructions and MIPs were obtained to evaluate the vascular anatomy. CONTRAST:  4mL OMNIPAQUE IOHEXOL 350 MG/ML SOLN COMPARISON:  CT head without contrast 05/07/2019. MRA head 08/09/15. FINDINGS: CTA HEAD Anterior circulation: The right internal carotid artery  is occluded. Vessel is reconstituted at the level of the ophthalmic artery. There is no contrast in the petrous or upper cervical segment. The right ICA terminus is intact. Atherosclerotic calcifications are present in the left cavernous internal carotid artery without a significant stenosis relative to the more distal vessel. Terminal left ICA is within normal limits. Terminal left ICA is larger than the right. The A1 and M1 segments are normal. MCA bifurcations are intact. ACA and MCA branch vessels are within normal limits. There is slight attenuation of distal right MCA branch vessels compared to the left. Posterior circulation: The left vertebral artery is the dominant vessel. Atherosclerotic calcifications are present at the dural margin of the right vertebral artery. Is moderate narrowing of the right V4 segment. Vertebrobasilar junction is normal. The basilar artery is within normal limits. Both posterior cerebral arteries originate from the basilar tip. PCA branch vessels are within normal limits. Venous sinuses: Dural sinuses and venous structures are not well seen due to early arterial timing. Anatomic variants: None IMPRESSION: 1. Occluded right internal carotid artery with reconstitution at the level of the right ophthalmic artery. This is chronic. 2. There is some attenuation of distal right MCA branch vessels compared to the left without a significant proximal stenosis or occlusion. 3. 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 Electronically Signed   By: San Morelle M.D.   On: 05/07/2019 17:49   Dg Chest 1 View  Result Date: 05/16/2019 CLINICAL DATA:  84 year old male with tachycardia. Negative for COVID-19. On 05/07/2019. EXAM: CHEST  1 VIEW COMPARISON:  Portable chest 05/08/2019 and earlier. FINDINGS: Portable AP upright view at 1642 hours. Lower lung volumes compared to 2017 and earlier. Stable cardiac size and mediastinal contours. No pneumothorax or pleural  effusion. Improved perihilar ventilation since 05/08/2019. There is residual asymmetric left lung base opacity. No other confluent pulmonary opacity. No pulmonary edema suspected. Negative visible bowel gas pattern. No acute osseous abnormality identified. IMPRESSION:  Improved but not resolved pulmonary opacity since 05/08/2019, with residual in the left lower lung. Consider persistent pulmonary infection. No pleural effusion is evident. Electronically Signed   By: Genevie Ann M.D.   On: 05/16/2019 17:02   Ct Head Wo Contrast  Result Date: 05/16/2019 CLINICAL DATA:  Frequent falls with confusion EXAM: CT HEAD WITHOUT CONTRAST TECHNIQUE: Contiguous axial images were obtained from the base of the skull through the vertex without intravenous contrast. COMPARISON:  CT brain 05/07/2019, MRI 05/07/2019 FINDINGS: Brain: No acute territorial infarction, hemorrhage or intracranial mass. Moderate atrophy. Moderate small vessel ischemic changes of the white matter. Stable ventricle size. Vascular: No hyperdense vessels. Vertebral and carotid vascular calcification Skull: Normal. Negative for fracture or focal lesion. Sinuses/Orbits: No acute finding. Other: None IMPRESSION: 1. No CT evidence for acute intracranial abnormality. 2. Atrophy and small vessel ischemic changes of the white matter Electronically Signed   By: Donavan Foil M.D.   On: 05/16/2019 17:47   Ct Head Wo Contrast  Result Date: 05/07/2019 CLINICAL DATA:  84 year old male with altered mental status and possible C-spine injury. EXAM: CT HEAD WITHOUT CONTRAST CT CERVICAL SPINE WITHOUT CONTRAST TECHNIQUE: Multidetector CT imaging of the head and cervical spine was performed following the standard protocol without intravenous contrast. Multiplanar CT image reconstructions of the cervical spine were also generated. COMPARISON:  05/30/2016 no prior exams FINDINGS: CT HEAD FINDINGS Brain: No evidence of acute infarction, hemorrhage, hydrocephalus, extra-axial  collection or mass lesion/mass effect. Moderate atrophy and chronic small-vessel white matter ischemic changes again noted. Vascular: Carotid and vertebral atherosclerotic calcifications identified. Skull: Normal. Negative for fracture or focal lesion. Sinuses/Orbits: No acute finding. Other: None. CT CERVICAL SPINE FINDINGS Alignment: Normal. Skull base and vertebrae: No acute fracture. No primary bone lesion or focal pathologic process. Soft tissues and spinal canal: No prevertebral fluid or swelling. No visible canal hematoma. Disc levels: Mild multilevel degenerative disc disease/spondylosis and moderate multilevel facet arthropathy noted contributing to central spinal and foraminal narrowing at several levels. Upper chest: A LEFT pleural effusion is noted and appears moderate. Other: None IMPRESSION: 1. No evidence of acute intracranial abnormality. Moderate atrophy and chronic small-vessel white matter ischemic changes. 2. No static evidence of acute injury to the cervical spine. Moderate multilevel degenerative changes. 3. LEFT pleural effusion, which appears moderate and is not well visualized on recent chest radiograph Electronically Signed   By: Margarette Canada M.D.   On: 05/07/2019 12:48   Ct Cervical Spine Wo Contrast  Result Date: 05/07/2019 CLINICAL DATA:  84 year old male with altered mental status and possible C-spine injury. EXAM: CT HEAD WITHOUT CONTRAST CT CERVICAL SPINE WITHOUT CONTRAST TECHNIQUE: Multidetector CT imaging of the head and cervical spine was performed following the standard protocol without intravenous contrast. Multiplanar CT image reconstructions of the cervical spine were also generated. COMPARISON:  05/30/2016 no prior exams FINDINGS: CT HEAD FINDINGS Brain: No evidence of acute infarction, hemorrhage, hydrocephalus, extra-axial collection or mass lesion/mass effect. Moderate atrophy and chronic small-vessel white matter ischemic changes again noted. Vascular: Carotid and  vertebral atherosclerotic calcifications identified. Skull: Normal. Negative for fracture or focal lesion. Sinuses/Orbits: No acute finding. Other: None. CT CERVICAL SPINE FINDINGS Alignment: Normal. Skull base and vertebrae: No acute fracture. No primary bone lesion or focal pathologic process. Soft tissues and spinal canal: No prevertebral fluid or swelling. No visible canal hematoma. Disc levels: Mild multilevel degenerative disc disease/spondylosis and moderate multilevel facet arthropathy noted contributing to central spinal and foraminal narrowing at several levels. Upper chest:  A LEFT pleural effusion is noted and appears moderate. Other: None IMPRESSION: 1. No evidence of acute intracranial abnormality. Moderate atrophy and chronic small-vessel white matter ischemic changes. 2. No static evidence of acute injury to the cervical spine. Moderate multilevel degenerative changes. 3. LEFT pleural effusion, which appears moderate and is not well visualized on recent chest radiograph Electronically Signed   By: Margarette Canada M.D.   On: 05/07/2019 12:48   Mr Brain Wo Contrast  Result Date: 05/07/2019 CLINICAL DATA:  Altered mental status.  Slurred speech. EXAM: MRI HEAD WITHOUT CONTRAST TECHNIQUE: Multiplanar, multiecho pulse sequences of the brain and surrounding structures were obtained without intravenous contrast. COMPARISON:  MRI brain CT head and CTA head 05/07/2019. MRI brain 08/09/2015 FINDINGS: Brain: Advanced atrophy and diffuse white matter disease is stable. No acute infarct, hemorrhage, or mass lesion is present. The ventricles are proportionate to the degree of atrophy. A remote lacunar infarct is present left lentiform nucleus. No significant extraaxial fluid collection is present. Vascular: The right internal carotid artery is chronically occluded vessel is reconstituted at the level of the ophthalmic or posterior communicating artery. Flow is present in the left internal carotid artery and in the  posterior circulation. Skull and upper cervical spine: The craniocervical junction is normal. Upper cervical spine is within normal limits. Marrow signal is unremarkable. Sinuses/Orbits: The paranasal sinuses and mastoid air cells are clear. A left lens replacement is present. Globes and orbits are otherwise within normal limits. IMPRESSION: 1. Stable chronic atrophy and white matter disease. 2. Remote lacunar infarct of the left lentiform nucleus. 3. Chronic occlusion of the right internal carotid artery. Electronically Signed   By: San Morelle M.D.   On: 05/07/2019 20:11   US Carotid Bilateral (at [facility] And Ap Only)  Result Date: 05/08/2019 CLINICAL DATA:  TIA.  History of hypertension, CAD and smoking. EXAM: BILATERAL CAROTID DUPLEX ULTRASOUND TECHNIQUE: Pearline Cables scale imaging, color Doppler and duplex ultrasound were performed of bilateral carotid and vertebral arteries in the neck. COMPARISON:  Brain MRI-08/09/2015 FINDINGS: Criteria: Quantification of carotid stenosis is based on velocity parameters that correlate the residual internal carotid diameter with NASCET-based stenosis levels, using the diameter of the distal internal carotid lumen as the denominator for stenosis measurement. The following velocity measurements were obtained: RIGHT ICA: Occluded at its origin CCA: 75/1 cm/sec SYSTOLIC ICA/CCA RATIO:  N/A ECA: 44 cm/sec LEFT ICA: 82/23 cm/sec CCA: 70/0 cm/sec SYSTOLIC ICA/CCA RATIO:  0.9 ECA: 128 cm/sec RIGHT CAROTID ARTERY: Chronic occlusion of the right internal carotid artery as demonstrated on remote brain MRI performed 07/2015. RIGHT VERTEBRAL ARTERY:  Antegrade Flow LEFT CAROTID ARTERY: There is a minimal to moderate amount of eccentric echogenic plaque within the left carotid bulb, extending to involve the origin and proximal aspects of the left internal carotid artery, not resulting in elevated peak systolic velocities within the interrogated course the left internal carotid artery to  suggest a hemodynamically significant stenosis, though note, velocity measurements are unreliable in the setting of a contralateral occlusion. LEFT VERTEBRAL ARTERY:  Antegrade Flow IMPRESSION: 1. Chronic occlusion of the right internal carotid artery as demonstrated on remote brain MRI performed 07/2015. 2. Minimal to moderate amount of left-sided atherosclerotic plaque, not resulting in elevated peak systolic velocities within the left internal carotid artery, though note, velocity measurements are unreliable in the setting of a contralateral occlusion. Further evaluation with CTA could be performed as indicated. 3. Antegrade flow demonstrated within the bilateral vertebral arteries. Electronically Signed   By: Jenny Reichmann  Watts M.D.   On: 05/08/2019 09:11   Dg Chest Port 1 View  Result Date: 05/08/2019 CLINICAL DATA:  84 year old male former smoker with history of right-sided lung cancer. Evaluate for pleural effusion. EXAM: PORTABLE CHEST 1 VIEW COMPARISON:  Chest x-ray 05/07/2019. FINDINGS: Multifocal interstitial worsening patchy and airspace disease throughout the lungs bilaterally, most severe throughout the left mid to lower lung, increased from yesterday's examination, most compatible with progressive multilobar bronchopneumonia. Possible small right pleural effusion. No definite left pleural effusion. Pulmonary vasculature is largely obscured by the above findings. Heart size is normal. The patient is rotated to the right on today's exam, resulting in distortion of the mediastinal contours and reduced diagnostic sensitivity and specificity for mediastinal pathology. Aortic atherosclerosis. IMPRESSION: 1. The appearance the chest suggests worsening multilobar bronchopneumonia, as above. 2. Aortic atherosclerosis. Electronically Signed   By: Vinnie Langton M.D.   On: 05/08/2019 05:57   Dg Chest Portable 1 View  Result Date: 05/07/2019 CLINICAL DATA:  Confusion and dementia. EXAM: PORTABLE CHEST 1 VIEW  COMPARISON:  05/30/2016 FINDINGS: The cardiomediastinal silhouette is unremarkable. New interstitial and possible airspace opacities within the LEFT lung noted. No definite pleural effusion or pneumothorax. No acute bony abnormality noted. IMPRESSION: New LEFT lung interstitial and possible airspace opacities. This may represent infection or asymmetric edema. Electronically Signed   By: Margarette Canada M.D.   On: 05/07/2019 12:31    EKG:  Atrial fibrillation with rapid ventricular response  Telemetry: Atrial fibrillation with controlled ventricular response; occasional PVCs  Echocardiogram: Mildly reduced LV function with an EF estimated between 45-50% with mild LVH, moderate to severe MR, mild AS with a valvular area calculated at 1.02cm2, mild AR   ASSESSMENT AND PLAN:  1.  Atrial fibrillation with rapid ventricular response   -Holter monitor in 2016 revealed a brief episode of atrial fibrillation, but he has not followed up with cardiology since  -Rate is better controlled; will continue with diltiazem infusion for now and transition to oral diltiazem with careful monitoring of BP   -Potassium supplementation recommended   -With a CHAD2sVASc score of at least 5 for age, hypertension, and previous TIA, will need to consider long term anticoagulation, however, HAS-BLED score of 8.9% puts him at a significant risk for bleeding  -Will need to discuss with primary caregiver as patient has underlying dementia   -Recommend follow up in an outpatient setting within 1 week of discharge  2.  Moderate to severe MR/Mild AS/AI  -Will need to follow this in an outpatient setting  3.  Hypokalemia   -Potassium supplementation recommended    The history, physical exam findings, and plan of care were all discussed with Dr. Bartholome Bill, and all decision making was made in collaboration.   Signed: Avie Arenas PA-C 05/17/2019, 10:28 AM

## 2019-05-17 NOTE — Progress Notes (Signed)
[facility] [place] NAME: Roberto Adams    MR#:  [id number]  DATE OF BIRTH:  Sep 02, 1935  SUBJECTIVE:  CHIEF COMPLAINT:   Chief Complaint  Patient presents with  . Tachycardia  No complaints, sitting on the bed.  Somewhat hypotensive REVIEW OF SYSTEMS:  Review of Systems  Constitutional: Negative for diaphoresis, fever, malaise/fatigue and weight loss.  HENT: Negative for ear discharge, ear pain, hearing loss, nosebleeds, sore throat and tinnitus.   Eyes: Negative for blurred vision and pain.  Respiratory: Negative for cough, hemoptysis, shortness of breath and wheezing.   Cardiovascular: Negative for chest pain, palpitations, orthopnea and leg swelling.  Gastrointestinal: Negative for abdominal pain, blood in stool, constipation, diarrhea, heartburn, nausea and vomiting.  Genitourinary: Negative for dysuria, frequency and urgency.  Musculoskeletal: Negative for back pain and myalgias.  Skin: Negative for itching and rash.  Neurological: Negative for dizziness, tingling, tremors, focal weakness, seizures, weakness and headaches.  Psychiatric/Behavioral: Negative for depression. The patient is not nervous/anxious.     DRUG ALLERGIES:   Allergies  Allergen Reactions  . Statins Other (See Comments)    Other reaction(s): Muscle Pain Other reaction(s): Muscle Pain   VITALS:  Blood pressure 93/66, pulse 76, temperature 97.7 F (36.5 C), temperature source Oral, resp. rate 16, height 5\' 6"  (1.676 m), weight 85.7 kg, SpO2 94 %. PHYSICAL EXAMINATION:  Physical Exam HENT:     Head: Normocephalic and atraumatic.  Eyes:     Conjunctiva/sclera: Conjunctivae normal.     Pupils: Pupils are equal, round, and reactive to light.  Neck:     Musculoskeletal: Normal range of motion and neck supple.     Thyroid: No thyromegaly.     Trachea: No tracheal deviation.  Cardiovascular:     Rate and Rhythm: Normal rate and regular rhythm.   Heart sounds: Normal heart sounds.  Pulmonary:     Effort: Pulmonary effort is normal. No respiratory distress.     Breath sounds: Normal breath sounds. No wheezing.  Chest:     Chest wall: No tenderness.  Abdominal:     General: Bowel sounds are normal. There is no distension.     Palpations: Abdomen is soft.     Tenderness: There is no abdominal tenderness.  Musculoskeletal: Normal range of motion.  Skin:    General: Skin is warm and dry.     Findings: No rash.  Neurological:     Mental Status: He is alert and oriented to person, place, and time.     Cranial Nerves: No cranial nerve deficit.    LABORATORY PANEL:  Male CBC Recent Labs  Lab 05/17/19 0450  WBC 7.4  HGB 12.4*  HCT 38.1*  PLT 258   ------------------------------------------------------------------------------------------------------------------ Chemistries  Recent Labs  Lab 05/17/19 0450  NA 141  K 3.2*  CL 106  CO2 25  GLUCOSE 90  BUN 26*  CREATININE 1.06  CALCIUM 9.6   RADIOLOGY:  Dg Chest 1 View  Result Date: 05/16/2019 CLINICAL DATA:  84 year old male with tachycardia. Negative for COVID-19. On 05/07/2019. EXAM: CHEST  1 VIEW COMPARISON:  Portable chest 05/08/2019 and earlier. FINDINGS: Portable AP upright view at 1642 hours. Lower lung volumes compared to 2017 and earlier. Stable cardiac size and mediastinal contours. No pneumothorax or pleural effusion. Improved perihilar ventilation since 05/08/2019. There is residual asymmetric left lung base opacity. No other confluent pulmonary opacity. No pulmonary edema suspected. Negative visible bowel  gas pattern. No acute osseous abnormality identified. IMPRESSION: Improved but not resolved pulmonary opacity since 05/08/2019, with residual in the left lower lung. Consider persistent pulmonary infection. No pleural effusion is evident. Electronically Signed   By: Genevie Ann M.D.   On: 05/16/2019 17:02   Ct Head Wo Contrast  Result Date: 05/16/2019 CLINICAL  DATA:  Frequent falls with confusion EXAM: CT HEAD WITHOUT CONTRAST TECHNIQUE: Contiguous axial images were obtained from the base of the skull through the vertex without intravenous contrast. COMPARISON:  CT brain 05/07/2019, MRI 05/07/2019 FINDINGS: Brain: No acute territorial infarction, hemorrhage or intracranial mass. Moderate atrophy. Moderate small vessel ischemic changes of the white matter. Stable ventricle size. Vascular: No hyperdense vessels. Vertebral and carotid vascular calcification Skull: Normal. Negative for fracture or focal lesion. Sinuses/Orbits: No acute finding. Other: None IMPRESSION: 1. No CT evidence for acute intracranial abnormality. 2. Atrophy and small vessel ischemic changes of the white matter Electronically Signed   By: Donavan Foil M.D.   On: 05/16/2019 17:47   ASSESSMENT AND PLAN:   1.  Atrial fibrillation with RVR -  Remains on diltiazem infusion, convert to oral Cardizem when able - Telemetry monitoring -  Appreciate cardiology Dr. Ubaldo Glassing input - Results of echocardiogram on 7/26 reviewed with 45 to 50% ejection fraction -May be high risk for bleeding considering underlying dementia, consider holding off anticoagulation  2.  Urinary tract infection: Based on UA - Continue IV Rocephin -Review urine culture results when available and adjust therapy accordingly  3.  Hypertension - Zestoretic continued  4.  BPH - Tamsulosin continued  5.  Hypokalemia -Replete and recheck    All the records are reviewed and case discussed with Care Management/Social Worker. Management plans discussed with the patient, nursing and they are in agreement.  CODE STATUS: Full Code  TOTAL TIME TAKING CARE OF THIS PATIENT: 35 minutes.   More than 50% of the time was spent in counseling/coordination of care: YES  POSSIBLE D/C IN 2-3 DAYS, DEPENDING ON CLINICAL CONDITION.   Max Sane M.D on 05/17/2019 at 4:12 PM  Between 7am to 6pm - Pager - [phone]  After 6pm go  to [email or web address] - Technical [employer] [facility]  Office  [phone]  CC: Primary care physician; Kirk Ruths, MD  Note: This dictation was prepared with Dragon dictation along with smaller phrase technology. Any transcriptional errors that result from this process are unintentional.

## 2019-05-17 NOTE — Evaluation (Signed)
Physical Therapy Evaluation Patient Details Name: Roberto Adams MRN: [id number] DOB: 16-Feb-1935 Today's Date: 05/17/2019   History of Present Illness  84yo M who came to [facility] on 7/25 w/ progressive confusion, found in driveway by wife, he went home and is now back with palpitations and weakness. PMH: TIA c aphasia, dementia, CAD, Rt Lung CA s/p lobe resection, HTN, HOH. CT of neck and head negative for acute findings. head MRI/MRA shows no acute infarct, but does show chronic Right ICA occlusion.  Clinical Impression  Pt able to answer some questions, but did display general confusion that appears to be near baseline.  Very soft spoken and at time difficult to understand, however eager to work with PT and do what he can.  He was able to get to sitting EOB with some assist, also needed light assist to get back to supine.  Pt is able to tolerate 2 separate bouts of limited ambulation (10 and 15 ft) with walker near bed, however with these efforts his HR did quickly rise from 80s to 120s and he c/o considerable fatigue.  Pt appears considerably weaker than he was while admitted just last week, he agrees with this and acknowledges that he may need to go to rehab on discharge.      Follow Up Recommendations SNF(pt was able to walk near 100 ft 1 week ago, much weaker now)    Equipment Recommendations       Recommendations for Other Services       Precautions / Restrictions Restrictions Weight Bearing Restrictions: No      Mobility  Bed Mobility Overal bed mobility: Needs Assistance Bed Mobility: Sit to Supine       Sit to supine: Mod assist   General bed mobility comments: Pt showed good effort in getting to EOB, but ultimately needed considerable assist and a lot of cuing  Transfers Overall transfer level: Needs assistance Equipment used: Rolling walker (2 wheeled)             General transfer comment: Pt showed reasonable confidence with getting to standing, needed heavy UE  use, but did not need excessive phyiscal assist.  Did initially rely on leaning back of LEs on bed to maintain balance  Ambulation/Gait Ambulation/Gait assistance: Min assist Gait Distance (Feet): 15 Feet Assistive device: Rolling walker (2 wheeled)       General Gait Details: Pt able to take some minimal forward and backward steps with walker in-room.  Did 2 bouts with seated rest break. HR starting in the 80/90s quickly rises to 110s and each time reached into the 130s with subjective c/o fatigue  Stairs            Wheelchair Mobility    Modified Rankin (Stroke Patients Only)       Balance Overall balance assessment: Needs assistance Sitting-balance support: No upper extremity supported;Feet supported Sitting balance-Leahy Scale: Good Sitting balance - Comments: Pt with poor posture, but did manage to maintain balance at EOB relatively well.                                     Pertinent Vitals/Pain Pain Assessment: No/denies pain    Home Living Family/patient expects to be discharged to:: Unsure Living Arrangements: Spouse/significant other Available Help at Discharge: Family Type of Home: House Home Access: Stairs to enter Entrance Stairs-Rails: Left Entrance Stairs-Number of Steps: 4 Home Layout: One level Home Equipment:  Walker - standard;Cane - single point Additional Comments: some situation gathered from prior notes, pt unable to consistently answer these questions    Prior Function Level of Independence: Needs assistance   Gait / Transfers Assistance Needed: SPC use for household distance AMB, occasional SW use when needed  ADL's / Homemaking Assistance Needed: Supervision from wife for some ADLl assist with IADL        Hand Dominance        Extremity/Trunk Assessment   Upper Extremity Assessment Upper Extremity Assessment: Generalized weakness;Difficult to assess due to impaired cognition(b/l UEs grossly functional, age  appropriate limitations)    Lower Extremity Assessment Lower Extremity Assessment: Generalized weakness;Difficult to assess due to impaired cognition(age appropriate limitations)       Communication   Communication: HOH  Cognition Arousal/Alertness: Awake/alert Behavior During Therapy: Flat affect Overall Cognitive Status: History of cognitive impairments - at baseline                                 General Comments: unsure of true baseline, but seems near what previous notes hae indicated`      General Comments      Exercises     Assessment/Plan    PT Assessment Patient needs continued PT services  PT Problem List Decreased strength;Decreased balance;Decreased mobility;Decreased safety awareness;Decreased knowledge of precautions       PT Treatment Interventions DME instruction;Balance training;Gait training;Stair training;Functional mobility training;Therapeutic activities;Therapeutic exercise;Patient/family education    PT Goals (Current goals can be found in the Care Plan section)  Acute Rehab PT Goals Patient Stated Goal: get stronger PT Goal Formulation: With patient Potential to Achieve Goals: Good    Frequency Min 2X/week   Barriers to discharge        Co-evaluation               AM-PAC PT "6 Clicks" Mobility  Outcome Measure Help needed turning from your back to your side while in a flat bed without using bedrails?: A Little Help needed moving from lying on your back to sitting on the side of a flat bed without using bedrails?: A Little Help needed moving to and from a bed to a chair (including a wheelchair)?: A Little Help needed standing up from a chair using your arms (e.g., wheelchair or bedside chair)?: A Little Help needed to walk in hospital room?: A Lot Help needed climbing 3-5 steps with a railing? : Total 6 Click Score: 15    End of Session Equipment Utilized During Treatment: Gait belt Activity Tolerance: Patient  limited by fatigue Patient left: in bed;with call bell/phone within reach Nurse Communication: Mobility status(change in vitals with activity) PT Visit Diagnosis: Unsteadiness on feet (R26.81);Difficulty in walking, not elsewhere classified (R26.2);Muscle weakness (generalized) (M62.81)    Time: 9381-8299 PT Time Calculation (min) (ACUTE ONLY): 29 min   Charges:   PT Evaluation $PT Eval Low Complexity: 1 Low PT Treatments $Gait Training: 8-22 mins        Kreg Shropshire, DPT 05/17/2019, 3:16 PM

## 2019-05-17 NOTE — ED Notes (Signed)
Attempted report to ICU again. Told they will call this RN back once ready.

## 2019-05-17 NOTE — ED Notes (Signed)
This RN and Beth NT repositioned pt. Pt given soda of choice and crackers, peanut butter, and apple sauce. Will give a food tray once available.

## 2019-05-18 LAB — BASIC METABOLIC PANEL
Anion gap: 11 (ref 5–15)
BUN: 22 mg/dL (ref 8–23)
CO2: 24 mmol/L (ref 22–32)
Calcium: 8.9 mg/dL (ref 8.9–10.3)
Chloride: 105 mmol/L (ref 98–111)
Creatinine, Ser: 0.96 mg/dL (ref 0.61–1.24)
GFR calc Af Amer: >60 mL/min (ref >60)
GFR calc non Af Amer: >60 mL/min (ref >60)
Glucose, Bld: 89 mg/dL (ref 70–99)
Potassium: 3.2 mmol/L — ABNORMAL LOW (ref 3.5–5.1)
Sodium: 140 mmol/L (ref 135–145)

## 2019-05-18 LAB — CBC
HCT: 37.5 % — ABNORMAL LOW (ref 39.0–52.0)
Hemoglobin: 12.3 g/dL — ABNORMAL LOW (ref 13.0–17.0)
MCH: 27.6 pg (ref 26.0–34.0)
MCHC: 32.8 g/dL (ref 30.0–36.0)
MCV: 84.1 fL (ref 80.0–100.0)
Platelets: 228 10*3/uL (ref 150–400)
RBC: 4.46 MIL/uL (ref 4.22–5.81)
RDW: 14.3 % (ref 11.5–15.5)
WBC: 8 10*3/uL (ref 4.0–10.5)
nRBC: 0 % (ref 0.0–0.2)

## 2019-05-18 LAB — URINE CULTURE: Culture: NO GROWTH

## 2019-05-18 MED ORDER — POTASSIUM CHLORIDE CRYS ER 20 MEQ PO TBCR
40.0000 meq | EXTENDED_RELEASE_TABLET | Freq: Two times a day (BID) | ORAL | Status: AC
  Administered 2019-05-18 (×2): 40 meq via ORAL
  Filled 2019-05-18 (×2): qty 2

## 2019-05-18 MED ORDER — HALOPERIDOL LACTATE 5 MG/ML IJ SOLN
INTRAMUSCULAR | Status: AC
  Administered 2019-05-18: 03:00:00 2 mg via INTRAVENOUS
  Filled 2019-05-18: qty 1

## 2019-05-18 MED ORDER — DILTIAZEM HCL ER COATED BEADS 120 MG PO CP24
120.0000 mg | ORAL_CAPSULE | Freq: Every day | ORAL | Status: DC
  Administered 2019-05-18 – 2019-05-22 (×5): 120 mg via ORAL
  Filled 2019-05-18 (×5): qty 1

## 2019-05-18 MED ORDER — HALOPERIDOL LACTATE 5 MG/ML IJ SOLN
2.0000 mg | Freq: Once | INTRAMUSCULAR | Status: AC
  Administered 2019-05-18: 2 mg via INTRAVENOUS

## 2019-05-18 NOTE — Progress Notes (Signed)
[facility] [place] NAME: Jasher Barkan    MR#:  [id number]  DATE OF BIRTH:  05-15-1935  SUBJECTIVE:  CHIEF COMPLAINT:   Chief Complaint  Patient presents with  . Tachycardia  Feels tired, hypotensive, hypokalemic REVIEW OF SYSTEMS:  Review of Systems  Constitutional: Negative for diaphoresis, fever, malaise/fatigue and weight loss.  HENT: Negative for ear discharge, ear pain, hearing loss, nosebleeds, sore throat and tinnitus.   Eyes: Negative for blurred vision and pain.  Respiratory: Negative for cough, hemoptysis, shortness of breath and wheezing.   Cardiovascular: Negative for chest pain, palpitations, orthopnea and leg swelling.  Gastrointestinal: Negative for abdominal pain, blood in stool, constipation, diarrhea, heartburn, nausea and vomiting.  Genitourinary: Negative for dysuria, frequency and urgency.  Musculoskeletal: Negative for back pain and myalgias.  Skin: Negative for itching and rash.  Neurological: Negative for dizziness, tingling, tremors, focal weakness, seizures, weakness and headaches.  Psychiatric/Behavioral: Negative for depression. The patient is not nervous/anxious.     DRUG ALLERGIES:   Allergies  Allergen Reactions  . Statins Other (See Comments)    Other reaction(s): Muscle Pain Other reaction(s): Muscle Pain   VITALS:  Blood pressure 94/62, pulse 87, temperature 97.6 F (36.4 C), temperature source Oral, resp. rate (!) 23, height 5\' 6"  (1.676 m), weight 85.7 kg, SpO2 95 %. PHYSICAL EXAMINATION:  Physical Exam HENT:     Head: Normocephalic and atraumatic.  Eyes:     Conjunctiva/sclera: Conjunctivae normal.     Pupils: Pupils are equal, round, and reactive to light.  Neck:     Musculoskeletal: Normal range of motion and neck supple.     Thyroid: No thyromegaly.     Trachea: No tracheal deviation.  Cardiovascular:     Rate and Rhythm: Normal rate and regular rhythm.     Heart sounds:  Normal heart sounds.  Pulmonary:     Effort: Pulmonary effort is normal. No respiratory distress.     Breath sounds: Normal breath sounds. No wheezing.  Chest:     Chest wall: No tenderness.  Abdominal:     General: Bowel sounds are normal. There is no distension.     Palpations: Abdomen is soft.     Tenderness: There is no abdominal tenderness.  Musculoskeletal: Normal range of motion.  Skin:    General: Skin is warm and dry.     Findings: No rash.  Neurological:     Mental Status: He is alert and oriented to person, place, and time.     Cranial Nerves: No cranial nerve deficit.    LABORATORY PANEL:  Male CBC Recent Labs  Lab 05/18/19 0425  WBC 8.0  HGB 12.3*  HCT 37.5*  PLT 228   ------------------------------------------------------------------------------------------------------------------ Chemistries  Recent Labs  Lab 05/18/19 0425  NA 140  K 3.2*  CL 105  CO2 24  GLUCOSE 89  BUN 22  CREATININE 0.96  CALCIUM 8.9   RADIOLOGY:  No results found. ASSESSMENT AND PLAN:   1.  Atrial fibrillation with RVR -  Remains on diltiazem infusion, convert to oral Cardizem when able - Telemetry monitoring -  Appreciate cardiology Dr. Ubaldo Glassing input - Results of echocardiogram on 7/26 reviewed with 45 to 50% ejection fraction -May be high risk for bleeding considering underlying dementia, consider holding off anticoagulation  2.  Urinary tract infection: Based on UA - Urine culture not having any growth.  She is afebrile and not having  no leukocytosis I will stop the antibiotic at this point this is likely treated by now  3.  Hypertension - Zestoretic continued  4.  BPH - Tamsulosin continued  5.  Hypokalemia -Replete and recheck   PT recommends SNF. c/s SW    All the records are reviewed and case discussed with Care Management/Social Worker. Management plans discussed with the patient, nursing and they are in agreement.  CODE STATUS: Full Code  TOTAL  TIME TAKING CARE OF THIS PATIENT: 35 minutes.   More than 50% of the time was spent in counseling/coordination of care: YES  POSSIBLE D/C IN 1 DAYS, DEPENDING ON CLINICAL CONDITION.   Max Sane M.D on 05/18/2019 at 2:15 PM  Between 7am to 6pm - Pager - [phone]  After 6pm go to [email or web address] - Technical brewer [facility]  Office  [phone]  CC: Primary care physician; Kirk Ruths, MD  Note: This dictation was prepared with Dragon dictation along with smaller phrase technology. Any transcriptional errors that result from this process are unintentional.

## 2019-05-18 NOTE — Progress Notes (Signed)
Received Roberto Adams from ICU. He is awake, alert and oriented, and pleasant at this time, but forgetful, and impulsive. Does not remember when he is asked not to get up alone. He is currently sitting in chair, in NAD; chair alarm in place. IV abx and carrier fluid hung via pump; infusing without difficulty. Denies pain.

## 2019-05-18 NOTE — NC FL2 (Signed)
[place] LEVEL OF CARE SCREENING TOOL     IDENTIFICATION  Patient Name: Roberto Adams Birthdate: [birth year] Sex: male Admission Date (Current Location): 05/16/2019  [place] and [place] Number:  Engineering geologist and Address:  [facility], [address] [place], [place] [address]      Provider Number: [id number]  Attending Physician Name and Address:  Max Sane, MD  Relative Name and Phone Number:       Current Level of Care: Hospital Recommended Level of Care: [place] Prior Approval Number:    Date Approved/Denied:   PASRR Number: Manual review  Discharge Plan: SNF    Current Diagnoses: Patient Active Problem List   Diagnosis Date Noted  . Atrial fibrillation with rapid ventricular response ([place]) 05/16/2019  . Stroke-like episode ([place]) 05/07/2019  . Gross hematuria 12/07/2015  . Amnesia, global, transient 08/20/2015  . HTN (hypertension) 08/08/2015  . CAD (coronary artery disease) 08/08/2015  . HLD (hyperlipidemia) 08/08/2015  . Dietary vitamin B12 deficiency anemia 08/08/2014  . Vasomotor rhinitis 08/08/2014  . Mild cognitive disorder 07/22/2014  . Essential (primary) hypertension 07/22/2014  . H/O malignant neoplasm 07/22/2014  . Pure hypercholesterolemia 07/22/2014  . Spinal stenosis, lumbar region, with neurogenic claudication 01/17/2013  . Lumbar canal stenosis 01/17/2013  . Benign prostatic hyperplasia with urinary obstruction 11/06/2012  . Elevated prostate specific antigen (PSA) 07/05/2012    Orientation RESPIRATION BLADDER Height & Weight     Self, Time, Situation, Place  Normal Incontinent Weight: 188 lb 15 oz (85.7 kg) Height:  5\' 6"  (167.6 cm)  BEHAVIORAL SYMPTOMS/MOOD NEUROLOGICAL BOWEL NUTRITION STATUS  (None) (None) Continent Diet  AMBULATORY STATUS COMMUNICATION OF NEEDS Skin   Limited Assist Verbally Bruising, Other (Comment)(Rash.)                       Personal Care  Assistance Level of Assistance  Bathing, Feeding, Dressing Bathing Assistance: Limited assistance Feeding assistance: Limited assistance Dressing Assistance: Limited assistance     Functional Limitations Info  Sight, Speech, Hearing Sight Info: Adequate Hearing Info: Adequate Speech Info: Adequate    SPECIAL CARE FACTORS FREQUENCY  PT (By licensed PT)     PT Frequency: 5 x week              Contractures Contractures Info: Not present    Additional Factors Info  Code Status, Allergies Code Status Info: Full code Allergies Info: Statins           Current Medications (05/18/2019):  This is the current hospital active medication list Current Facility-Administered Medications  Medication Dose Route Frequency Provider Last Rate Last Dose  . acetaminophen (TYLENOL) tablet 650 mg  650 mg Oral Q4H PRN Seals, Theo Dills, NP      . aspirin EC tablet 81 mg  81 mg Oral Daily Seals, Theo Dills, NP   81 mg at 05/18/19 0901  . cefTRIAXone (ROCEPHIN) 1 g in sodium chloride 0.9 % 100 mL IVPB  1 g Intravenous Q24H Seals, Theo Dills, NP   Stopped at 05/17/19 1900  . Chlorhexidine Gluconate Cloth 2 % PADS 6 each  6 each Topical Q0600 Mayer Camel, NP   6 each at 05/18/19 0556  . diltiazem (CARDIZEM CD) 24 hr capsule 120 mg  120 mg Oral Daily Doristine Mango L, PA-C      . enoxaparin (LOVENOX) injection 85 mg  1 mg/kg Subcutaneous Q12H Seals, Theo Dills, NP   85 mg at  05/18/19 0747  . ondansetron (ZOFRAN) injection 4 mg  4 mg Intravenous Q6H PRN Seals, Levada Dy H, NP      . potassium chloride SA (K-DUR) CR tablet 40 mEq  40 mEq Oral BID Max Sane, MD   40 mEq at 05/18/19 0901     Discharge Medications: Please see discharge summary for a list of discharge medications.  Relevant Imaging Results:  Relevant Lab Results:   Additional Information SS#: [id number]  Candie Chroman, LCSW

## 2019-05-18 NOTE — Progress Notes (Signed)
Roberto Adams is an 84 year old male who presented to the ED with atrial fibrillation with RVR.  Admitted to ICU for further management and treatment.  Rate has remained well controlled.  Discussed the need for anticoagulation, but with HAS-BLED score of 4, is at high risk for bleeding.  We will discuss long term anticoagulation in an outpatient setting.

## 2019-05-18 NOTE — TOC Initial Note (Signed)
Transition of Care [facility]) - Initial/Assessment Note    Patient Details  Name: Roberto Adams MRN: [id number] Date of Birth: Oct 07, 1935  Transition of Care [facility]) CM/SW Contact:    Candie Chroman, LCSW Phone Number: 05/18/2019, 3:46 PM  Clinical Narrative:  CSW met with patient, introduced role, and explained that PT recommendations would be discussed. Patient initially refused SNF placement stating he would die there. After further discussion he is willing to consider it. He gave CSW permission to call his wife to discuss as well. Patient's wife is agreeable to SNF placement. She will come by tomorrow to discuss with patient further. PASARR under manual review. No further concerns. CSW encouraged patient and his wife to contact CSW as needed. CSW will continue to follow patient and his wife for support and facilitate discharge to SNF, if agreeable, once medically stable.                Expected Discharge Plan: Skilled Nursing Facility Barriers to Discharge: Continued Medical Work up   Patient Goals and CMS Choice   CMS Medicare.gov Compare Post Acute Care list provided to:: Patient(Made wife aware it is in the room for her to review tomorrow.)    Expected Discharge Plan and Services Expected Discharge Plan: [place]       Living arrangements for the past 2 months: Single Family Home                                      Prior Living Arrangements/Services Living arrangements for the past 2 months: Single Family Home Lives with:: Spouse Patient language and need for interpreter reviewed:: Yes Do you feel safe going back to the place where you live?: Yes      Need for Family Participation in Patient Care: Yes (Comment) Care giver support system in place?: Yes (comment)   Criminal Activity/Legal Involvement Pertinent to Current Situation/Hospitalization: No - Comment as needed  Activities of Daily Living Home Assistive Devices/Equipment: Walker (specify  type) ADL Screening (condition at time of admission) Patient's cognitive ability adequate to safely complete daily activities?: No Is the patient deaf or have difficulty hearing?: Yes Does the patient have difficulty seeing, even when wearing glasses/contacts?: No Does the patient have difficulty concentrating, remembering, or making decisions?: Yes Patient able to express need for assistance with ADLs?: No Does the patient have difficulty dressing or bathing?: Yes Independently performs ADLs?: No Communication: Needs assistance Is this a change from baseline?: Pre-admission baseline Dressing (OT): Needs assistance Is this a change from baseline?: Pre-admission baseline Grooming: Needs assistance Is this a change from baseline?: Pre-admission baseline Feeding: Independent Bathing: Needs assistance Is this a change from baseline?: Pre-admission baseline Toileting: Needs assistance Is this a change from baseline?: Pre-admission baseline In/Out Bed: Needs assistance Walks in Home: Needs assistance Is this a change from baseline?: Pre-admission baseline Does the patient have difficulty walking or climbing stairs?: Yes Weakness of Legs: Both Weakness of Arms/Hands: None  Permission Sought/Granted Permission sought to share information with : Facility Sport and exercise psychologist, Family Supports Permission granted to share information with : Yes, Verbal Permission Granted  Share Information with NAME: Armel Rabbani  Permission granted to share info w AGENCY: SNF's  Permission granted to share info w Relationship: Wife  Permission granted to share info w Contact Information: [phone]  Emotional Assessment Appearance:: Appears stated age Attitude/Demeanor/Rapport: Engaged Affect (typically observed): Appropriate, Calm, Pleasant Orientation: :  Oriented to Self, Oriented to Place, Oriented to  Time, Oriented to Situation Alcohol / Substance Use: Never Used Psych Involvement: No  (comment)  Admission diagnosis:  Tachycardia [R00.0] Atrial fibrillation with RVR (HCC) [I48.91] Patient Active Problem List   Diagnosis Date Noted  . Atrial fibrillation with rapid ventricular response ([place]) 05/16/2019  . Stroke-like episode ([place]) 05/07/2019  . Gross hematuria 12/07/2015  . Amnesia, global, transient 08/20/2015  . HTN (hypertension) 08/08/2015  . CAD (coronary artery disease) 08/08/2015  . HLD (hyperlipidemia) 08/08/2015  . Dietary vitamin B12 deficiency anemia 08/08/2014  . Vasomotor rhinitis 08/08/2014  . Mild cognitive disorder 07/22/2014  . Essential (primary) hypertension 07/22/2014  . H/O malignant neoplasm 07/22/2014  . Pure hypercholesterolemia 07/22/2014  . Spinal stenosis, lumbar region, with neurogenic claudication 01/17/2013  . Lumbar canal stenosis 01/17/2013  . Benign prostatic hyperplasia with urinary obstruction 11/06/2012  . Elevated prostate specific antigen (PSA) 07/05/2012   PCP:  Kirk Ruths, MD Pharmacy:   [facility] [address]), [place] [address] [place]) [place] [address] Phone: [phone] Fax: [phone]     Social Determinants of Health (SDOH) Interventions    Readmission Risk Interventions No flowsheet data found.

## 2019-05-19 MED ORDER — POTASSIUM CHLORIDE CRYS ER 20 MEQ PO TBCR
40.0000 meq | EXTENDED_RELEASE_TABLET | Freq: Once | ORAL | Status: AC
  Administered 2019-05-19: 40 meq via ORAL
  Filled 2019-05-19: qty 2

## 2019-05-19 NOTE — TOC Progression Note (Addendum)
Transition of Care [facility]) - Progression Note    Patient Details  Name: Roberto Adams MRN: [id number] Date of Birth: 1935-07-20  Transition of Care [facility]) CM/SW Contact  Ross Ludwig, [place] Phone Number: 05/19/2019, 5:33 PM  Clinical Narrative:  CSW presented bed offers to patient's wife, they have chosen [facility].  CSW contacted [facility] to inform them of patient's choice.  [place] will have to start insurance auth.  Passar manual screen requesting clinical information, CSW uploaded requested information to Passar, awaiting for Passar number.    Expected Discharge Plan: [place] Barriers to Discharge: Continued Medical Work up  Expected Discharge Plan and Services Expected Discharge Plan: [place] arrangements for the past 2 months: Single Family Home                                       Social Determinants of Health (SDOH) Interventions    Readmission Risk Interventions No flowsheet data found.

## 2019-05-19 NOTE — Care Management Important Message (Signed)
Important Message  Patient Details  Name: Roberto Adams MRN: [id number] Date of Birth: 1935/10/06   Medicare Important Message Given:  Yes     Dannette Barbara 05/19/2019, 2:03 PM

## 2019-05-19 NOTE — Progress Notes (Signed)
[place] at [place] NAME: Roberto Adams    MR#:  [id number]  DATE OF BIRTH:  [birth year]  SUBJECTIVE:  CHIEF COMPLAINT:   Chief Complaint  Patient presents with  . Tachycardia  Feels tired, was coughing earlier when I saw him, pleasantly confused REVIEW OF SYSTEMS:  Review of Systems  Constitutional: Negative for diaphoresis, fever, malaise/fatigue and weight loss.  HENT: Negative for ear discharge, ear pain, hearing loss, nosebleeds, sore throat and tinnitus.   Eyes: Negative for blurred vision and pain.  Respiratory: Positive for cough. Negative for hemoptysis, shortness of breath and wheezing.   Cardiovascular: Negative for chest pain, palpitations, orthopnea and leg swelling.  Gastrointestinal: Negative for abdominal pain, blood in stool, constipation, diarrhea, heartburn, nausea and vomiting.  Genitourinary: Negative for dysuria, frequency and urgency.  Musculoskeletal: Negative for back pain and myalgias.  Skin: Negative for itching and rash.  Neurological: Negative for dizziness, tingling, tremors, focal weakness, seizures, weakness and headaches.  Psychiatric/Behavioral: Negative for depression. The patient is not nervous/anxious.    DRUG ALLERGIES:   Allergies  Allergen Reactions  . Statins Other (See Comments)    Other reaction(s): Muscle Pain Other reaction(s): Muscle Pain   VITALS:  Blood pressure (!) 151/83, pulse 93, temperature 98.2 F (36.8 C), temperature source Oral, resp. rate 20, height 5\' 6"  (1.676 m), weight 81 kg, SpO2 94 %. PHYSICAL EXAMINATION:  Physical Exam HENT:     Head: Normocephalic and atraumatic.  Eyes:     Conjunctiva/sclera: Conjunctivae normal.     Pupils: Pupils are equal, round, and reactive to light.  Neck:     Musculoskeletal: Normal range of motion and neck supple.     Thyroid: No thyromegaly.     Trachea: No tracheal deviation.  Cardiovascular:     Rate and Rhythm: Normal  rate and regular rhythm.     Heart sounds: Normal heart sounds.  Pulmonary:     Effort: Pulmonary effort is normal. No respiratory distress.     Breath sounds: Normal breath sounds. No wheezing.  Chest:     Chest wall: No tenderness.  Abdominal:     General: Bowel sounds are normal. There is no distension.     Palpations: Abdomen is soft.     Tenderness: There is no abdominal tenderness.  Musculoskeletal: Normal range of motion.  Skin:    General: Skin is warm and dry.     Findings: No rash.  Neurological:     Mental Status: He is alert and oriented to person, place, and time.     Cranial Nerves: No cranial nerve deficit.    LABORATORY PANEL:  Male CBC Recent Labs  Lab 05/18/19 0425  WBC 8.0  HGB 12.3*  HCT 37.5*  PLT 228   ------------------------------------------------------------------------------------------------------------------ Chemistries  Recent Labs  Lab 05/18/19 0425  NA 140  K 3.2*  CL 105  CO2 24  GLUCOSE 89  BUN 22  CREATININE 0.96  CALCIUM 8.9   RADIOLOGY:  No results found. ASSESSMENT AND PLAN:   1.  Atrial fibrillation with RVR -  converted to oral Cardizem -rate is well controlled at this time - Telemetry monitoring -  Appreciate cardiology Dr. Ubaldo Glassing input - Results of echocardiogram on 7/26 reviewed with 45 to 50% ejection fraction - high risk for bleeding considering underlying dementia, holding off long-term anticoagulation  2.  Urinary tract infection: Based on UA - Urine culture not having any  growth. He is afebrile and not having no leukocytosis I will stop the antibiotic at this point this is likely treated by now  3.  Hypertension - Zestoretic continued  4.  BPH - Tamsulosin continued  5.  Hypokalemia -Replete and recheck   PT recommends SNF.  Social worker working on placement  I tried to call patient's spouse/son over the phone number listed but no luck.  Overall poor prognosis.  Await palliative care consultation  for goals of care he may be hospice/outpatient palliative care appropriate    All the records are reviewed and case discussed with Care Management/Social Worker. Management plans discussed with the patient, nursing and they are in agreement.  CODE STATUS: Full Code  TOTAL TIME TAKING CARE OF THIS PATIENT: 35 minutes.   More than 50% of the time was spent in counseling/coordination of care: YES  POSSIBLE D/C IN 1 DAYS, DEPENDING ON CLINICAL CONDITION.   Max Sane M.D on 05/19/2019 at 12:36 PM  Between 7am to 6pm - Pager - [phone]  After 6pm go to [email or web address] - Technical brewer [place] [facility]  Office  [phone]  CC: Primary care physician; Kirk Ruths, MD  Note: This dictation was prepared with Dragon dictation along with smaller phrase technology. Any transcriptional errors that result from this process are unintentional.

## 2019-05-19 NOTE — Progress Notes (Signed)
ANTICOAGULATION CONSULT NOTE - Initial Consult  Pharmacy Consult for Lovenox Indication: atrial fibrillation  Allergies  Allergen Reactions  . Statins Other (See Comments)    Other reaction(s): Muscle Pain Other reaction(s): Muscle Pain    Patient Measurements: Height: 5\' 6"  (167.6 cm) Weight: 178 lb 9.2 oz (81 kg) IBW/kg (Calculated) : 63.8 Heparin Dosing Weight:    Vital Signs: Temp: 98.2 F (36.8 C) (08/06 0806) Temp Source: Oral (08/06 0806) BP: 151/83 (08/06 0806) Pulse Rate: 93 (08/06 0806)  Labs: Recent Labs    05/16/19 1637 05/16/19 1939 05/16/19 2209 05/17/19 0450 05/18/19 0425  HGB 13.5  --   --  12.4* 12.3*  HCT 41.3  --   --  38.1* 37.5*  PLT 309  --   --  258 228  CREATININE 1.62*  --   --  1.06 0.96  TROPONINIHS 28* 26* 22*  --   --     Estimated Creatinine Clearance: 59.3 mL/min (by C-G formula based on SCr of 0.96 mg/dL).   Medical History: Past Medical History:  Diagnosis Date  . Amnesia, global, transient 08/20/2015   Overview:  Better at [facility], mri negative. On lipitor and plavix. Only issue is insomnia now.    . Arthritis   . Benign prostatic hyperplasia with urinary obstruction 11/06/2012  . CAD (coronary artery disease)   . Cancer of right lung ([place])    Right Upper lobe resection.   . Cancer of skin, squamous cell   . Elevated prostate specific antigen (PSA) 07/05/2012  . Enlarged prostate   . Hearing aid worn    both ears  . History of lung cancer 2001   Rt upper lobe; s/p chemo and radiation with recurrence  . HLD (hyperlipidemia)   . Hypertension   . Lumbar canal stenosis 01/17/2013  . Mild cognitive disorder 07/22/2014   Last Assessment & Plan:  Memory seems to be stable   . Nocturia associated with benign prostatic hypertrophy   . Pure hypercholesterolemia 07/22/2014   Last Assessment & Plan:  Has been working on a low fat diet and no myalgia's are present.    Marland Kitchen Spinal stenosis, lumbar region, with neurogenic claudication  01/17/2013  . TIA (transient ischemic attack) 08/08/2015  . Vasomotor rhinitis 08/08/2014    Medications:  Scheduled:  . aspirin EC  81 mg Oral Daily  . Chlorhexidine Gluconate Cloth  6 each Topical Q0600  . diltiazem  120 mg Oral Daily  . enoxaparin (LOVENOX) injection  1 mg/kg Subcutaneous Q12H  . potassium chloride  40 mEq Oral Once   Assessment: 84 yo M to start Lovenox for Afib w/ RVR. Home meds: plavix/ASA. Hgb 13.5 >>12.4 >> 12.3  Plt 309 >> 258 >> 228 Scr 1.62 >>1.06 >> 0.96  Crcl 37 >> 59.3  ML/min   Goal of Therapy:  Monitor platelets by anticoagulation protocol: Yes   Plan:  Will order Lovenox 1 mg/kg q12h (85 kg) for treatment dose for Atrial Fibrillation.  Will f/u Scr and CBC per protocol  Rowland Lathe 05/19/2019,1:32 PM

## 2019-05-19 NOTE — Progress Notes (Signed)
Talked to patient's wife Vaughan Basta and updated her about the plan of care for this patient. Wife is also wanting to talked to MD. Notify Dr. Manuella Ghazi and MD, did called the wife and left a message but he will try to call her again.

## 2019-05-19 NOTE — Progress Notes (Signed)
PT Cancellation Note  Patient Details Name: GAJE TENNYSON MRN: [id number] DOB: 1935/03/02   Cancelled Treatment:    Reason Eval/Treat Not Completed: Fatigue/lethargy limiting ability to participate. Treatment attempted; pt soundly sleeping and unable to fully awaken for participation. Re attempt at a later date.    Larae Grooms, PTA 05/19/2019, 5:23 PM

## 2019-05-20 DIAGNOSIS — Z7189 Other specified counseling: Secondary | ICD-10-CM

## 2019-05-20 DIAGNOSIS — Z515 Encounter for palliative care: Secondary | ICD-10-CM

## 2019-05-20 LAB — CBC
HCT: 37.9 % — ABNORMAL LOW (ref 39.0–52.0)
Hemoglobin: 12.7 g/dL — ABNORMAL LOW (ref 13.0–17.0)
MCH: 27.7 pg (ref 26.0–34.0)
MCHC: 33.5 g/dL (ref 30.0–36.0)
MCV: 82.6 fL (ref 80.0–100.0)
Platelets: 215 10*3/uL (ref 150–400)
RBC: 4.59 MIL/uL (ref 4.22–5.81)
RDW: 14.4 % (ref 11.5–15.5)
WBC: 9.8 10*3/uL (ref 4.0–10.5)
nRBC: 0 % (ref 0.0–0.2)

## 2019-05-20 LAB — BASIC METABOLIC PANEL
Anion gap: 10 (ref 5–15)
BUN: 17 mg/dL (ref 8–23)
CO2: 23 mmol/L (ref 22–32)
Calcium: 9.1 mg/dL (ref 8.9–10.3)
Chloride: 101 mmol/L (ref 98–111)
Creatinine, Ser: 0.82 mg/dL (ref 0.61–1.24)
GFR calc Af Amer: >60 mL/min (ref >60)
GFR calc non Af Amer: >60 mL/min (ref >60)
Glucose, Bld: 98 mg/dL (ref 70–99)
Potassium: 4.2 mmol/L (ref 3.5–5.1)
Sodium: 134 mmol/L — ABNORMAL LOW (ref 135–145)

## 2019-05-20 MED ORDER — DILTIAZEM HCL ER COATED BEADS 120 MG PO CP24: 120.0000 mg | ORAL_CAPSULE | Freq: Every day | ORAL | 0 refills | Status: DC

## 2019-05-20 MED ORDER — ENOXAPARIN SODIUM 80 MG/0.8ML ~~LOC~~ SOLN: 1.0000 mg/kg | Freq: Two times a day (BID) | SUBCUTANEOUS | Status: DC

## 2019-05-20 NOTE — NC FL2 (Signed)
[place] LEVEL OF CARE SCREENING TOOL     IDENTIFICATION  Patient Name: Roberto Adams Birthdate: 1935/07/12 Sex: male Admission Date (Current Location): 05/16/2019  [place] and [place] Number:  Engineering geologist and Address:  [facility], [address], [place], [place] [address]      Provider Number: [id number]  Attending Physician Name and Address:  Max Sane, MD  Relative Name and Phone Number:       Current Level of Care: Hospital Recommended Level of Care: [place] Prior Approval Number:    Date Approved/Denied:   PASRR Number: [id number]   Discharge Plan: SNF    Current Diagnoses: Patient Active Problem List   Diagnosis Date Noted  . Atrial fibrillation with rapid ventricular response ([place]) 05/16/2019  . Stroke-like episode ([place]) 05/07/2019  . Gross hematuria 12/07/2015  . Amnesia, global, transient 08/20/2015  . HTN (hypertension) 08/08/2015  . CAD (coronary artery disease) 08/08/2015  . HLD (hyperlipidemia) 08/08/2015  . Dietary vitamin B12 deficiency anemia 08/08/2014  . Vasomotor rhinitis 08/08/2014  . Mild cognitive disorder 07/22/2014  . Essential (primary) hypertension 07/22/2014  . H/O malignant neoplasm 07/22/2014  . Pure hypercholesterolemia 07/22/2014  . Spinal stenosis, lumbar region, with neurogenic claudication 01/17/2013  . Lumbar canal stenosis 01/17/2013  . Benign prostatic hyperplasia with urinary obstruction 11/06/2012  . Elevated prostate specific antigen (PSA) 07/05/2012    Orientation RESPIRATION BLADDER Height & Weight     Self, Time, Situation, Place  Normal Incontinent Weight: 81 kg Height:  5\' 6"  (167.6 cm)  BEHAVIORAL SYMPTOMS/MOOD NEUROLOGICAL BOWEL NUTRITION STATUS  (None) (None) Continent Diet  AMBULATORY STATUS COMMUNICATION OF NEEDS Skin   Limited Assist Verbally Bruising, Other (Comment)(Rash.)                       Personal Care Assistance Level of  Assistance  Bathing, Feeding, Dressing Bathing Assistance: Limited assistance Feeding assistance: Limited assistance Dressing Assistance: Limited assistance     Functional Limitations Info  Sight, Speech, Hearing Sight Info: Adequate Hearing Info: Adequate Speech Info: Adequate    SPECIAL CARE FACTORS FREQUENCY  PT (By licensed PT)     PT Frequency: 5 x week              Contractures Contractures Info: Not present    Additional Factors Info  Code Status, Allergies Code Status Info: Full code Allergies Info: Statins           Current Medications (05/20/2019):  This is the current hospital active medication list Current Facility-Administered Medications  Medication Dose Route Frequency Provider Last Rate Last Dose  . acetaminophen (TYLENOL) tablet 650 mg  650 mg Oral Q4H PRN Seals, Theo Dills, NP      . aspirin EC tablet 81 mg  81 mg Oral Daily Seals, Theo Dills, NP   81 mg at 05/20/19 0831  . Chlorhexidine Gluconate Cloth 2 % PADS 6 each  6 each Topical Q0600 Mayer Camel, NP   6 each at 05/19/19 [phone]  . diltiazem (CARDIZEM CD) 24 hr capsule 120 mg  120 mg Oral Daily Doristine Mango L, PA-C   120 mg at 05/20/19 0831  . enoxaparin (LOVENOX) injection 85 mg  1 mg/kg Subcutaneous Q12H Seals, Theo Dills, NP   85 mg at 05/20/19 0831  . ondansetron (ZOFRAN) injection 4 mg  4 mg Intravenous Q6H PRN Seals, Theo Dills, NP         Discharge Medications: Please  see discharge summary for a list of discharge medications.  Relevant Imaging Results:  Relevant Lab Results:   Additional Information SS#: [id number]  Katrina Stack, RN

## 2019-05-20 NOTE — Progress Notes (Signed)
[place] at [place] NAME: Roberto Adams    MR#:  [id number]  DATE OF BIRTH:  June 18, 1935  SUBJECTIVE:  CHIEF COMPLAINT:   Chief Complaint  Patient presents with  . Tachycardia  Slowly improving. heart rate in the better control REVIEW OF SYSTEMS:  Review of Systems  Constitutional: Negative for diaphoresis, fever, malaise/fatigue and weight loss.  HENT: Negative for ear discharge, ear pain, hearing loss, nosebleeds, sore throat and tinnitus.   Eyes: Negative for blurred vision and pain.  Respiratory: Positive for cough. Negative for hemoptysis, shortness of breath and wheezing.   Cardiovascular: Negative for chest pain, palpitations, orthopnea and leg swelling.  Gastrointestinal: Negative for abdominal pain, blood in stool, constipation, diarrhea, heartburn, nausea and vomiting.  Genitourinary: Negative for dysuria, frequency and urgency.  Musculoskeletal: Negative for back pain and myalgias.  Skin: Negative for itching and rash.  Neurological: Negative for dizziness, tingling, tremors, focal weakness, seizures, weakness and headaches.  Psychiatric/Behavioral: Negative for depression. The patient is not nervous/anxious.    DRUG ALLERGIES:   Allergies  Allergen Reactions  . Statins Other (See Comments)    Other reaction(s): Muscle Pain Other reaction(s): Muscle Pain   VITALS:  Blood pressure (!) 146/75, pulse 94, temperature 98.5 F (36.9 C), temperature source Oral, resp. rate 19, height 5\' 6"  (1.676 m), weight 81 kg, SpO2 90 %. PHYSICAL EXAMINATION:  Physical Exam HENT:     Head: Normocephalic and atraumatic.  Eyes:     Conjunctiva/sclera: Conjunctivae normal.     Pupils: Pupils are equal, round, and reactive to light.  Neck:     Musculoskeletal: Normal range of motion and neck supple.     Thyroid: No thyromegaly.     Trachea: No tracheal deviation.  Cardiovascular:     Rate and Rhythm: Normal rate and regular  rhythm.     Heart sounds: Normal heart sounds.  Pulmonary:     Effort: Pulmonary effort is normal. No respiratory distress.     Breath sounds: Normal breath sounds. No wheezing.  Chest:     Chest wall: No tenderness.  Abdominal:     General: Bowel sounds are normal. There is no distension.     Palpations: Abdomen is soft.     Tenderness: There is no abdominal tenderness.  Musculoskeletal: Normal range of motion.  Skin:    General: Skin is warm and dry.     Findings: No rash.  Neurological:     Mental Status: He is alert and oriented to person, place, and time.     Cranial Nerves: No cranial nerve deficit.    LABORATORY PANEL:  Male CBC Recent Labs  Lab 05/20/19 0656  WBC 9.8  HGB 12.7*  HCT 37.9*  PLT 215   ------------------------------------------------------------------------------------------------------------------ Chemistries  Recent Labs  Lab 05/20/19 0656  NA 134*  K 4.2  CL 101  CO2 23  GLUCOSE 98  BUN 17  CREATININE 0.82  CALCIUM 9.1   RADIOLOGY:  No results found. ASSESSMENT AND PLAN:   1.  Atrial fibrillation with RVR -  converted to oral Cardizem -rate is well controlled at this time - Telemetry monitoring -  Appreciate cardiology Dr. Ubaldo Glassing input - Results of echocardiogram on 7/26 reviewed with 45 to 50% ejection fraction - high risk for bleeding considering underlying dementia, holding off long-term anticoagulation  2.  Urinary tract infection: Based on UA - Urine culture not having any growth. He is  afebrile and not having no leukocytosis I will stop the antibiotic at this point this is likely treated by now  3.  Hypertension - Zestoretic continued  4.  BPH - Tamsulosin continued  5.  Hypokalemia -Repleted   PT recommends SNF.  Social worker working on placement -waiting on Tourist information centre manager if obtained today he could go to [employer]  I discussed this with patient's spouse.  Overall poor prognosis.  Await  palliative care consultation for goals of care he may be hospice/outpatient palliative care appropriate    All the records are reviewed and case discussed with Care Management/Social Worker. Management plans discussed with the patient, nursing and they are in agreement.  CODE STATUS: Full Code  TOTAL TIME TAKING CARE OF THIS PATIENT: 35 minutes.   More than 50% of the time was spent in counseling/coordination of care: YES  POSSIBLE D/C EITHER LATER TODAY OR WHEN INSURANCE AUTH OBTAINED   Max Sane M.D on 05/20/2019 at 2:39 PM  Between 7am to 6pm - Pager - [phone]  After 6pm go to [email or web address] - Technical brewer [place] [facility]  Office  [phone]  CC: Primary care physician; Kirk Ruths, MD  Note: This dictation was prepared with Dragon dictation along with smaller phrase technology. Any transcriptional errors that result from this process are unintentional.

## 2019-05-20 NOTE — Consult Note (Addendum)
Consultation Note Date: 05/20/2019   Patient Name: Roberto Adams  DOB: 11/03/1934  MRN: [id number]  Age / Sex: 84 y.o., male  PCP: Kirk Ruths, MD Referring Physician: Max Sane, MD  Reason for Consultation: Establishing goals of care  HPI/Patient Profile: Roberto Adams  is a 84 y.o. male with a known history of right upper lobe lung cancer status post chemotherapy and radiation, hypertension, HLD, CAD, BPH, and CVA. He was admitted for A fib with RVR.   Clinical Assessment and Goals of Care: Patient is resting in bed. Wife at bedside. Patient does not really speak only saying "coughing" when asked how he is doing. He falls asleep during conversation. His wife states they have been married since 1962. They have 3 living children, the fourth died at 64 years old from cancer.   She states functionally, he has declined for the past few years since his back fracture. She states 6 weeks ago, he transitioned from using a cane to a walker, and over the past 6 weeks he has been sleeping more. They do not leave the house unless they are going to a doctor's appointment. His oral intake has declined a bit because she no longer cooks for him due to her keto diet, and orders his food out, or heats microwave meals. She states she was advised following his stroke that he has dementia. Cognitively, she states he still remembers everyone, but sometimes have difficulty remembering details, or things she has told him. He performs his own ADL's.   We discussed his diagnosis, prognosis, GOC, EOL wishes disposition and options.  A detailed discussion was had today regarding advanced directives.  Concepts specific to code status, artifical feeding and hydration, and ventilator support were discussed.  The difference between an aggressive medical intervention path and a comfort care path was discussed.  Values and goals of care  important to patient and family were attempted to be elicited.  Discussed limitations of medical interventions to prolong quality of life in some situations and discussed the concept of human mortality.  She states she is not sure about his wishes for GOC as they have not had t hose discussions, and will have to consider his options. She states she will give this some thought and speak with him and her children as able. She would like for him to go to SNF to attempt rehab.     SUMMARY OF RECOMMENDATIONS   Recommend palliative at D/C.   Prognosis:   Unable to determine  Discharge Planning: [facility] for rehab with Palliative care service follow-up      Primary Diagnoses: Present on Admission: . Atrial fibrillation with rapid ventricular response ([place])   I have reviewed the medical record, interviewed the patient and family, and examined the patient. The following aspects are pertinent.  Past Medical History:  Diagnosis Date  . Amnesia, global, transient 08/20/2015   Overview:  Better at [facility], mri negative. On lipitor and plavix. Only issue is insomnia now.    . Arthritis   .  Benign prostatic hyperplasia with urinary obstruction 11/06/2012  . CAD (coronary artery disease)   . Cancer of right lung ([place])    Right Upper lobe resection.   . Cancer of skin, squamous cell   . Elevated prostate specific antigen (PSA) 07/05/2012  . Enlarged prostate   . Hearing aid worn    both ears  . History of lung cancer 2001   Rt upper lobe; s/p chemo and radiation with recurrence  . HLD (hyperlipidemia)   . Hypertension   . Lumbar canal stenosis 01/17/2013  . Mild cognitive disorder 07/22/2014   Last Assessment & Plan:  Memory seems to be stable   . Nocturia associated with benign prostatic hypertrophy   . Pure hypercholesterolemia 07/22/2014   Last Assessment & Plan:  Has been working on a low fat diet and no myalgia's are present.    Marland Kitchen Spinal stenosis, lumbar region, with  neurogenic claudication 01/17/2013  . TIA (transient ischemic attack) 08/08/2015  . Vasomotor rhinitis 08/08/2014   Social History   Socioeconomic History  . Marital status: Married    Spouse name: Not on file  . Number of children: Not on file  . Years of education: Not on file  . Highest education level: Not on file  Occupational History  . Not on file  Social Needs  . Financial resource strain: Not on file  . Food insecurity    Worry: Not on file    Inability: Not on file  . Transportation needs    Medical: Not on file    Non-medical: Not on file  Tobacco Use  . Smoking status: Former Smoker    Packs/day: 1.00    Years: 35.00    Pack years: 35.00    Types: Cigarettes  . Smokeless tobacco: Never Used  . Tobacco comment: quit smoking 1992  Substance and Sexual Activity  . Alcohol use: No  . Drug use: No  . Sexual activity: Not on file  Lifestyle  . Physical activity    Days per week: Not on file    Minutes per session: Not on file  . Stress: Not on file  Relationships  . Social Herbalist on phone: Not on file    Gets together: Not on file    Attends religious service: Not on file    Active member of club or organization: Not on file    Attends meetings of clubs or organizations: Not on file    Relationship status: Not on file  Other Topics Concern  . Not on file  Social History Narrative  . Not on file   Family History  Problem Relation Age of Onset  . Alzheimer's disease Mother   . Alcohol abuse Father   . Heart disease Father   . Prostate cancer Brother   . Kidney disease Neg Hx   . Kidney cancer Neg Hx   . Bladder Cancer Neg Hx    Scheduled Meds: . Chlorhexidine Gluconate Cloth  6 each Topical Q0600  . diltiazem  120 mg Oral Daily   Continuous Infusions: PRN Meds:.acetaminophen, ondansetron (ZOFRAN) IV Medications Prior to Admission:  Prior to Admission medications   Medication Sig Start Date End Date Taking? Authorizing Provider   aspirin 81 MG tablet Take 81 mg by mouth daily.   Yes [provider]  Cholecalciferol (VITAMIN D-1000 MAX ST) 25 MCG (1000 UT) tablet Take 1,000 mg by mouth daily.   Yes [provider]  clopidogrel (PLAVIX) 75 MG tablet  Take 1 tablet (75 mg total) by mouth daily. 08/09/15  Yes Gladstone Lighter, MD  finasteride (PROSCAR) 5 MG tablet Take 1 tablet by mouth once daily 04/18/19  Yes McGowan, Larene Beach A, PA-C  lisinopril-hydrochlorothiazide (ZESTORETIC) 10-12.5 MG tablet Take 1 tablet by mouth daily.  09/03/15  Yes [provider]  tamsulosin (FLOMAX) 0.4 MG CAPS capsule Take 1 capsule (0.4 mg total) by mouth daily. 04/20/19  Yes McGowan, Larene Beach A, PA-C  vitamin B-12 (CYANOCOBALAMIN) 1000 MCG tablet Take 1,000 mcg by mouth daily.   Yes [provider]  diltiazem (CARDIZEM CD) 120 MG 24 hr capsule Take 1 capsule (120 mg total) by mouth daily. 05/21/19   Max Sane, MD   Allergies  Allergen Reactions  . Statins Other (See Comments)    Other reaction(s): Muscle Pain Other reaction(s): Muscle Pain   Review of Systems  Genitourinary: Positive for hematuria.    Physical Exam Pulmonary:     Effort: Pulmonary effort is normal.  Neurological:     Mental Status: He is alert.     Vital Signs: BP (!) 146/75 (BP Location: Left Arm)   Pulse 94   Temp 98.5 F (36.9 C) (Oral)   Resp 19   Ht 5\' 6"  (1.676 m)   Wt 81 kg   SpO2 90%   BMI 28.82 kg/m  Pain Scale: 0-10 POSS *See Group Information*: 1-Acceptable,Awake and alert Pain Score: 0-No pain   SpO2: SpO2: 90 % O2 Device:SpO2: 90 % O2 Flow Rate: .O2 Flow Rate (L/min): 2 L/min  IO: Intake/output summary:   Intake/Output Summary (Last 24 hours) at 05/20/2019 1607 Last data filed at 05/19/2019 2343 Gross per 24 hour  Intake -  Output 0 ml  Net 0 ml    LBM: Last BM Date: 05/18/19(per pt ) Baseline Weight: Weight: 85.5 kg Most recent weight: Weight: 81 kg     Palliative Assessment/Data:     Time  In: 3:30 Time Out: 4:20 Time Total: 50 min Greater than 50%  of this time was spent counseling and coordinating care related to the above assessment and plan.  Signed by: Asencion Gowda, NP   Please contact Palliative Medicine Team phone at [phone] for questions and concerns.  For individual provider: See Shea Evans

## 2019-05-20 NOTE — Discharge Instructions (Signed)
Atrial Fibrillation ° °Atrial fibrillation is a type of heartbeat that is irregular or fast (rapid). If you have this condition, your heart beats without any order. This makes it hard for your heart to pump blood in a normal way. Having this condition gives you more risk for stroke, heart failure, and other heart problems. °Atrial fibrillation may start all of a sudden and then stop on its own, or it may become a long-lasting problem. °What are the causes? °This condition may be caused by heart conditions, such as: °· High blood pressure. °· Heart failure. °· Heart valve disease. °· Heart surgery. °Other causes include: °· Pneumonia. °· Obstructive sleep apnea. °· Lung cancer. °· Thyroid disease. °· Drinking too much alcohol. °Sometimes the cause is not known. °What increases the risk? °You are more likely to develop this condition if: °· You smoke. °· You are older. °· You have diabetes. °· You are overweight. °· You have a family history of this condition. °· You exercise often and hard. °What are the signs or symptoms? °Common symptoms of this condition include: °· A feeling like your heart is beating very fast. °· Chest pain. °· Feeling short of breath. °· Feeling light-headed or weak. °· Getting tired easily. °Follow these instructions at home: °Medicines °· Take over-the-counter and prescription medicines only as told by your doctor. °· If your doctor gives you a blood-thinning medicine, take it exactly as told. Taking too much of it can cause bleeding. Taking too little of it does not protect you against clots. Clots can cause a stroke. °Lifestyle ° °  ° °· Do not use any tobacco products. These include cigarettes, chewing tobacco, and e-cigarettes. If you need help quitting, ask your doctor. °· Do not drink alcohol. °· Do not drink beverages that have caffeine. These include coffee, soda, and tea. °· Follow diet instructions as told by your doctor. °· Exercise regularly as told by your doctor. °General  instructions °· If you have a condition that causes breathing to stop for a short period of time (apnea), treat it as told by your doctor. °· Keep a healthy weight. Do not use diet pills unless your doctor says they are safe for you. Diet pills may make heart problems worse. °· Keep all follow-up visits as told by your doctor. This is important. °Contact a doctor if: °· You notice a change in the speed, rhythm, or strength of your heartbeat. °· You are taking a blood-thinning medicine and you see more bruising. °· You get tired more easily when you move or exercise. °· You have a sudden change in weight. °Get help right away if: ° °· You have pain in your chest or your belly (abdomen). °· You have trouble breathing. °· You have blood in your vomit, poop, or pee (urine). °· You have any signs of a stroke. "BE FAST" is an easy way to remember the main warning signs: °? B - Balance. Signs are dizziness, sudden trouble walking, or loss of balance. °? E - Eyes. Signs are trouble seeing or a change in how you see. °? F - Face. Signs are sudden weakness or loss of feeling in the face, or the face or eyelid drooping on one side. °? A - Arms. Signs are weakness or loss of feeling in an arm. This happens suddenly and usually on one side of the body. °? S - Speech. Signs are sudden trouble speaking, slurred speech, or trouble understanding what people say. °? T - Time.   Time to call emergency services. Write down what time symptoms started. °· You have other signs of a stroke, such as: °? A sudden, very bad headache with no known cause. °? Feeling sick to your stomach (nausea). °? Throwing up (vomiting). °? Jerky movements you cannot control (seizure). °These symptoms may be an emergency. Do not wait to see if the symptoms will go away. Get medical help right away. Call your local emergency services (911 in the [place] Do not drive yourself to the hospital. °Summary °· Atrial fibrillation is a type of heartbeat that is irregular  or fast (rapid). °· You are at higher risk of this condition if you smoke, are older, have diabetes, or are overweight. °· Follow your doctor's instructions about medicines, diet, exercise, and follow-up visits. °· Get help right away if you think that you have signs of a stroke. °This information is not intended to replace advice given to you by your health care provider. Make sure you discuss any questions you have with your health care provider. °Document Released: 07/08/2008 Document Revised: 12/03/2017 Document Reviewed: 11/20/2017 °Elsevier Patient Education © 2020 [employer] ° °

## 2019-05-20 NOTE — Plan of Care (Signed)
  Problem: Pain Managment: Goal: General experience of comfort will improve Outcome: Progressing   Problem: Safety: Goal: Ability to remain free from injury will improve Outcome: Progressing   

## 2019-05-20 NOTE — Progress Notes (Signed)
Physical Therapy Treatment Patient Details Name: Roberto Adams MRN: [id number] DOB: 1934/11/19 Today's Date: 05/20/2019    History of Present Illness 84yo M who came to [facility] on 7/25 w/ progressive confusion, found in driveway by wife, he went home and is now back with palpitations and weakness. PMH: TIA c aphasia, dementia, CAD, Rt Lung CA s/p lobe resection, HTN, HOH. CT of neck and head negative for acute findings. head MRI/MRA shows no acute infarct, but does show chronic Right ICA occlusion.    PT Comments    Pt soundly sleeping upon entry, but able to awaken through voice and touch. Pt able to participate; however, groggy throughout. Pt demonstrates increased need for assist with all functional mobility. Mod A for bed mobility to/from edge of bed and total assist to reposition upward in bed. Poor sitting balance with intermittent R lateral lean and need for Min A to correct. STS performed from elevated bed with Mod A and rolling walker; requires sequence cues and pt uses LEs against bed to assist static stand with Min A to steady. Poor overall stand posture and maintains static stand for 30 seconds. Difficulty maintaining neutral spine and hip/knee extension. Pt returned to bed with Mod A and total A to reposition upward in bed. Continue PT to progress activity tolerance, strength and endurance to improve functional mobility as able.   Follow Up Recommendations  SNF     Equipment Recommendations       Recommendations for Other Services       Precautions / Restrictions Precautions Precautions: Fall Restrictions Weight Bearing Restrictions: No    Mobility  Bed Mobility Overal bed mobility: Needs Assistance Bed Mobility: Supine to Sit;Sit to Supine     Supine to sit: Mod assist Sit to supine: Mod assist   General bed mobility comments: Gives good effort overall, but requires assist at LEs and trunk to sit/lie down. Total A to reposition upward in bed  Transfers Overall  transfer level: Needs assistance Equipment used: Rolling walker (2 wheeled) Transfers: Sit to/from Stand Sit to Stand: Mod assist;From elevated surface         General transfer comment: slow to rise, sequence cues and for activation of quads/gluts and upright posture.   Ambulation/Gait             General Gait Details: unable   Stairs             Wheelchair Mobility    Modified Rankin (Stroke Patients Only)       Balance Overall balance assessment: Needs assistance Sitting-balance support: Bilateral upper extremity supported;Feet supported Sitting balance-Leahy Scale: Poor Sitting balance - Comments: Requires intermittent cues and assist to right self to the L; R lean Postural control: Right lateral lean Standing balance support: Bilateral upper extremity supported Standing balance-Leahy Scale: Poor Standing balance comment: Braces LEs against bed for additional support and requires therapist support. Unable to lift either leg                            Cognition Arousal/Alertness: Lethargic(able to awaken for participation; overall groggy) Behavior During Therapy: Flat affect Overall Cognitive Status: History of cognitive impairments - at baseline                                 General Comments: able to answer questions. aware of self and states history of little ambulation prior  to admit.       Exercises      General Comments        Pertinent Vitals/Pain Pain Assessment: No/denies pain    Home Living                      Prior Function            PT Goals (current goals can now be found in the care plan section) Progress towards PT goals: Not progressing toward goals - comment    Frequency    Min 2X/week      PT Plan Current plan remains appropriate    Co-evaluation              AM-PAC PT "6 Clicks" Mobility   Outcome Measure  Help needed turning from your back to your side while in a  flat bed without using bedrails?: A Lot Help needed moving from lying on your back to sitting on the side of a flat bed without using bedrails?: A Lot Help needed moving to and from a bed to a chair (including a wheelchair)?: A Lot Help needed standing up from a chair using your arms (e.g., wheelchair or bedside chair)?: A Lot Help needed to walk in hospital room?: Total Help needed climbing 3-5 steps with a railing? : Total 6 Click Score: 10    End of Session Equipment Utilized During Treatment: Gait belt Activity Tolerance: Patient limited by fatigue;Other (comment)(weakness) Patient left: in bed;with call bell/phone within reach;with bed alarm set Nurse Communication: Other (comment)(SW regarding session) PT Visit Diagnosis: Unsteadiness on feet (R26.81);Difficulty in walking, not elsewhere classified (R26.2);Muscle weakness (generalized) (M62.81)     Time: 0370-4888 PT Time Calculation (min) (ACUTE ONLY): 20 min  Charges:  $Therapeutic Activity: 8-22 mins                      Larae Grooms, PTA 05/20/2019, 10:43 AM

## 2019-05-20 NOTE — TOC Progression Note (Signed)
Transition of Care [facility]) - Progression Note    Patient Details  Name: Roberto Adams MRN: [id number] Date of Birth: [birth year]  Transition of Care [facility]) CM/SW Contact  Katrina Stack, RN Phone Number: 05/20/2019, 9:14 AM  Clinical Narrative:    [facility] information for manual review sent late evening 8/6. Reached out to physical therapy regarding need for treatment today as early as possible for SNF auth consideration    Expected Discharge Plan: [place] Barriers to Discharge: Continued Medical Work up  Expected Discharge Plan and Services Expected Discharge Plan: [place] arrangements for the past 2 months: Single Family Home                                       Social Determinants of Health (SDOH) Interventions    Readmission Risk Interventions No flowsheet data found.

## 2019-05-20 NOTE — Discharge Summary (Signed)
[place] [facility] [place] NAME: Roberto Adams    MR#:  [id number]  DATE OF BIRTH:  02/12/1935  DATE OF ADMISSION:  05/16/2019   ADMITTING PHYSICIAN: Gwynneth Aliment, MD  DATE OF DISCHARGE: 05/20/2019  PRIMARY CARE PHYSICIAN: Kirk Ruths, MD   ADMISSION DIAGNOSIS:  Tachycardia [R00.0] Atrial fibrillation with RVR ([place]) [I48.91] DISCHARGE DIAGNOSIS:  Active Problems:   Atrial fibrillation with rapid ventricular response ([place])  SECONDARY DIAGNOSIS:   Past Medical History:  Diagnosis Date   Amnesia, global, transient 08/20/2015   Overview:  Better at [facility], mri negative. On lipitor and plavix. Only issue is insomnia now.     Arthritis    Benign prostatic hyperplasia with urinary obstruction 11/06/2012   CAD (coronary artery disease)    Cancer of right lung (HCC)    Right Upper lobe resection.    Cancer of skin, squamous cell    Elevated prostate specific antigen (PSA) 07/05/2012   Enlarged prostate    Hearing aid worn    both ears   History of lung cancer 2001   Rt upper lobe; s/p chemo and radiation with recurrence   HLD (hyperlipidemia)    Hypertension    Lumbar canal stenosis 01/17/2013   Mild cognitive disorder 07/22/2014   Last Assessment & Plan:  Memory seems to be stable    Nocturia associated with benign prostatic hypertrophy    Pure hypercholesterolemia 07/22/2014   Last Assessment & Plan:  Has been working on a low fat diet and no myalgia's are present.     Spinal stenosis, lumbar region, with neurogenic claudication 01/17/2013   TIA (transient ischemic attack) 08/08/2015   Vasomotor rhinitis 08/08/2014   HOSPITAL COURSE:   1.Atrial fibrillation with RVR - converted to oral Cardizem -rate is well controlled at this time -Echocardiogram on 7/26 with 45 to 50% ejection fraction - high risk for bleeding considering underlying dementia, holding off long-term anticoagulation per cardio  2. Urinary  tract infection: treated. Urine c/s with no growth  3. Hypertension -Zestoretic   4. BPH -Tamsulosin   5.  Hypokalemia -Repleted DISCHARGE CONDITIONS:  stable CONSULTS OBTAINED:  Treatment Team:  Teodoro Spray, MD DRUG ALLERGIES:   Allergies  Allergen Reactions   Statins Other (See Comments)    Other reaction(s): Muscle Pain Other reaction(s): Muscle Pain   DISCHARGE MEDICATIONS:   Allergies as of 05/20/2019      Reactions   Statins Other (See Comments)   Other reaction(s): Muscle Pain Other reaction(s): Muscle Pain      Medication List    TAKE these medications   aspirin 81 MG tablet Take 81 mg by mouth daily.   clopidogrel 75 MG tablet Commonly known as: Plavix Take 1 tablet (75 mg total) by mouth daily.   diltiazem 120 MG 24 hr capsule Commonly known as: CARDIZEM CD Take 1 capsule (120 mg total) by mouth daily. Start taking on: May 21, 2019   finasteride 5 MG tablet Commonly known as: PROSCAR Take 1 tablet by mouth once daily   lisinopril-hydrochlorothiazide 10-12.5 MG tablet Commonly known as: ZESTORETIC Take 1 tablet by mouth daily.   tamsulosin 0.4 MG Caps capsule Commonly known as: FLOMAX Take 1 capsule (0.4 mg total) by mouth daily.   vitamin B-12 1000 MCG tablet Commonly known as: CYANOCOBALAMIN Take 1,000 mcg by mouth daily.   Vitamin D-1000 Max St 25 MCG (1000 UT) tablet Generic drug: Cholecalciferol Take 1,000 mg by mouth daily.  DISCHARGE INSTRUCTIONS:   DIET:  Cardiac diet DISCHARGE CONDITION:  Stable ACTIVITY:  Activity as tolerated OXYGEN:  Home Oxygen: No.  Oxygen Delivery: room air DISCHARGE LOCATION:  nursing home   If you experience worsening of your admission symptoms, develop shortness of breath, life threatening emergency, suicidal or homicidal thoughts you must seek medical attention immediately by calling 911 or calling your MD immediately  if symptoms less severe.  You Must read complete  instructions/literature along with all the possible adverse reactions/side effects for all the Medicines you take and that have been prescribed to you. Take any new Medicines after you have completely understood and accpet all the possible adverse reactions/side effects.   Please note  You were cared for by a hospitalist during your hospital stay. If you have any questions about your discharge medications or the care you received while you were in the hospital after you are discharged, you can call the unit and asked to speak with the hospitalist on call if the hospitalist that took care of you is not available. Once you are discharged, your primary care physician will handle any further medical issues. Please note that NO REFILLS for any discharge medications will be authorized once you are discharged, as it is imperative that you return to your primary care physician (or establish a relationship with a primary care physician if you do not have one) for your aftercare needs so that they can reassess your need for medications and monitor your lab values.    On the day of Discharge:  VITAL SIGNS:  Blood pressure (!) 146/75, pulse 94, temperature 98.5 F (36.9 C), temperature source Oral, resp. rate 19, height 5\' 6"  (1.676 m), weight 81 kg, SpO2 90 %. PHYSICAL EXAMINATION:  GENERAL:  84 y.o.-year-old patient lying in the bed with no acute distress.  EYES: Pupils equal, round, reactive to light and accommodation. No scleral icterus. Extraocular muscles intact.  HEENT: Head atraumatic, normocephalic. Oropharynx and nasopharynx clear.  NECK:  Supple, no jugular venous distention. No thyroid enlargement, no tenderness.  LUNGS: Normal breath sounds bilaterally, no wheezing, rales,rhonchi or crepitation. No use of accessory muscles of respiration.  CARDIOVASCULAR: S1, S2 normal. No murmurs, rubs, or gallops.  ABDOMEN: Soft, non-tender, non-distended. Bowel sounds present. No organomegaly or mass.    EXTREMITIES: No pedal edema, cyanosis, or clubbing.  NEUROLOGIC: Cranial nerves II through XII are intact. Muscle strength 5/5 in all extremities. Sensation intact. Gait not checked.  PSYCHIATRIC: The patient is alert and oriented x 3.  SKIN: No obvious rash, lesion, or ulcer.  DATA REVIEW:   CBC Recent Labs  Lab 05/20/19 0656  WBC 9.8  HGB 12.7*  HCT 37.9*  PLT 215    Chemistries  Recent Labs  Lab 05/20/19 0656  NA 134*  K 4.2  CL 101  CO2 23  GLUCOSE 98  BUN 17  CREATININE 0.82  CALCIUM 9.1      Contact information for follow-up providers    Kirk Ruths, MD. Schedule an appointment as soon as possible for a visit in 1 week(s).   Specialty: Internal Medicine Contact information: [place] [address] [phone]        Teodoro Spray, MD. Schedule an appointment as soon as possible for a visit in 1 week(s).   Specialty: Cardiology Contact information: [place] [address] [phone]            Contact information for  after-discharge care    [facility] .   Service: Skilled Nursing Contact information: [place] [phone]                  Management plans discussed with the patient, family and they are in agreement.  CODE STATUS: Full Code   TOTAL TIME TAKING CARE OF THIS PATIENT: 45 minutes.    Max Sane M.D on 05/20/2019 at 1:43 PM  Between 7am to 6pm - Pager - [phone]  After 6pm go to [email or web address] - Technical brewer [facility]  Office  [phone]  CC: Primary care physician; Kirk Ruths, MD   Note: This dictation was prepared with Dragon dictation along with smaller phrase technology. Any transcriptional errors that result from this process are unintentional.

## 2019-05-20 NOTE — Progress Notes (Signed)
ANTICOAGULATION CONSULT NOTE - Initial Consult  Pharmacy Consult for Lovenox Indication: atrial fibrillation  Allergies  Allergen Reactions  . Statins Other (See Comments)    Other reaction(s): Muscle Pain Other reaction(s): Muscle Pain    Patient Measurements: Height: 5\' 6"  (167.6 cm) Weight: 178 lb 9.2 oz (81 kg) IBW/kg (Calculated) : 63.8 Heparin Dosing Weight:    Vital Signs: Temp: 98.5 F (36.9 C) (08/07 0725) Temp Source: Oral (08/07 0725) BP: 146/75 (08/07 0725) Pulse Rate: 94 (08/07 1012)  Labs: Recent Labs    05/18/19 0425 05/20/19 0656  HGB 12.3* 12.7*  HCT 37.5* 37.9*  PLT 228 215  CREATININE 0.96 0.82    Estimated Creatinine Clearance: 69.5 mL/min (by C-G formula based on SCr of 0.82 mg/dL).   Medical History: Past Medical History:  Diagnosis Date  . Amnesia, global, transient 08/20/2015   Overview:  Better at [facility], mri negative. On lipitor and plavix. Only issue is insomnia now.    . Arthritis   . Benign prostatic hyperplasia with urinary obstruction 11/06/2012  . CAD (coronary artery disease)   . Cancer of right lung ([place])    Right Upper lobe resection.   . Cancer of skin, squamous cell   . Elevated prostate specific antigen (PSA) 07/05/2012  . Enlarged prostate   . Hearing aid worn    both ears  . History of lung cancer 2001   Rt upper lobe; s/p chemo and radiation with recurrence  . HLD (hyperlipidemia)   . Hypertension   . Lumbar canal stenosis 01/17/2013  . Mild cognitive disorder 07/22/2014   Last Assessment & Plan:  Memory seems to be stable   . Nocturia associated with benign prostatic hypertrophy   . Pure hypercholesterolemia 07/22/2014   Last Assessment & Plan:  Has been working on a low fat diet and no myalgia's are present.    Marland Kitchen Spinal stenosis, lumbar region, with neurogenic claudication 01/17/2013  . TIA (transient ischemic attack) 08/08/2015  . Vasomotor rhinitis 08/08/2014    Medications:  Scheduled:  . aspirin EC  81 mg  Oral Daily  . Chlorhexidine Gluconate Cloth  6 each Topical Q0600  . diltiazem  120 mg Oral Daily  . enoxaparin (LOVENOX) injection  1 mg/kg Subcutaneous Q12H   Assessment: 84 yo M to start Lovenox for Afib w/ RVR. Home meds: plavix/ASA.  Per cards, patient will follow-up outpatient for anticoagulant plan.   Hgb 13.5 >>12.4 >> 12.3 > 12.7 Plt 309 >> 258 >> 228 > 215  Scr 1.62 >>1.06 >> 0.96 > 0.82  Crcl 37 >> 59.3  mL/min  >> 69.5 mL.min   Goal of Therapy:  Monitor platelets by anticoagulation protocol: Yes   Plan:  Will order Lovenox 1 mg/kg q12h (80 kg) for treatment dose for Atrial Fibrillation.  Will f/u Scr and CBC per protocol  Darik Massing R Bertel Venard 05/20/2019,1:02 PM

## 2019-05-20 NOTE — TOC Progression Note (Signed)
Transition of Care [facility]) - Progression Note    Patient Details  Name: JAKOREY MCCONATHY MRN: [id number] Date of Birth: Mar 08, 1935  Transition of Care [facility] CM/SW Contact  Katrina Stack, RN Phone Number: 05/20/2019, 4:30 PM  Clinical Narrative:   Ramirez-Perez relayed that have not had a response from [facility].  Even if received auth today, can not accept because their pharmacy closes at 4p.  Unable to accept patient over weekend.  CM spoke updated wife, and attending.  Through secure chat, communicated the need for physical therapy to see patient over the weekend as insurance company will be requesting another note come Monday morning.  Also communicated the need for covid test Sunday August 9.    Expected Discharge Plan: [place] Barriers to Discharge: Continued Medical Work up  Expected Discharge Plan and Services Expected Discharge Plan: [place] arrangements for the past 2 months: Single Family Home Expected Discharge Date: 05/20/19                                     Social Determinants of Health (SDOH) Interventions    Readmission Risk Interventions No flowsheet data found.

## 2019-05-21 LAB — CBC
HCT: 35.4 % — ABNORMAL LOW (ref 39.0–52.0)
Hemoglobin: 12.1 g/dL — ABNORMAL LOW (ref 13.0–17.0)
MCH: 28.1 pg (ref 26.0–34.0)
MCHC: 34.2 g/dL (ref 30.0–36.0)
MCV: 82.3 fL (ref 80.0–100.0)
Platelets: 222 10*3/uL (ref 150–400)
RBC: 4.3 MIL/uL (ref 4.22–5.81)
RDW: 14.4 % (ref 11.5–15.5)
WBC: 9.4 10*3/uL (ref 4.0–10.5)
nRBC: 0.2 % (ref 0.0–0.2)

## 2019-05-21 LAB — CREATININE, SERUM
Creatinine, Ser: 0.95 mg/dL (ref 0.61–1.24)
GFR calc Af Amer: >60 mL/min (ref >60)
GFR calc non Af Amer: >60 mL/min (ref >60)

## 2019-05-21 NOTE — Plan of Care (Signed)

## 2019-05-21 NOTE — TOC Progression Note (Addendum)
Transition of Care [facility]) - Progression Note    Patient Details  Name: Roberto Adams MRN: [id number] Date of Birth: 08-17-1935  [facility] Care [facility]) CM/SW Contact  Latanya Maudlin, RN Phone Number: 05/21/2019, 10:50 AM  Clinical Narrative: Plan remains to discharge on Monday to [facility] as they are unable to take weekend admission. PT is expected to continue to see the patient over the weekend. Will order updated COVID test for tomorrow.      Update- received call from [facility], insurance has approved for SNF stay. [place] aware.     Expected Discharge Plan: [place] Barriers to Discharge: Continued Medical Work up  Expected Discharge Plan and Services Expected Discharge Plan: [place] arrangements for the past 2 months: Single Family Home Expected Discharge Date: 05/20/19                                     Social Determinants of Health (SDOH) Interventions    Readmission Risk Interventions No flowsheet data found.

## 2019-05-21 NOTE — Progress Notes (Signed)
Roberto Adams NAME: Roberto Adams    MR#:  [id number]  DATE OF BIRTH:  1935/03/06  SUBJECTIVE:  CHIEF COMPLAINT:   Chief Complaint  Patient presents with  . Tachycardia  Heart rate better controlled REVIEW OF SYSTEMS:  Review of Systems  Constitutional: Negative for diaphoresis, fever, malaise/fatigue and weight loss.  HENT: Negative for ear discharge, ear pain, hearing loss, nosebleeds, sore throat and tinnitus.   Eyes: Negative for blurred vision and pain.  Respiratory: Positive for cough. Negative for hemoptysis, shortness of breath and wheezing.   Cardiovascular: Negative for chest pain, palpitations, orthopnea and leg swelling.  Gastrointestinal: Negative for abdominal pain, blood in stool, constipation, diarrhea, heartburn, nausea and vomiting.  Genitourinary: Negative for dysuria, frequency and urgency.  Musculoskeletal: Negative for back pain and myalgias.  Skin: Negative for itching and rash.  Neurological: Negative for dizziness, tingling, tremors, focal weakness, seizures, weakness and headaches.  Psychiatric/Behavioral: Negative for depression. The patient is not nervous/anxious.    DRUG ALLERGIES:   Allergies  Allergen Reactions  . Statins Other (See Comments)    Other reaction(s): Muscle Pain Other reaction(s): Muscle Pain   VITALS:  Blood pressure (!) 143/78, pulse 92, temperature 98.3 F (36.8 C), temperature source Oral, resp. rate 19, height 5\' 6"  (1.676 m), weight 86.2 kg, SpO2 94 %. PHYSICAL EXAMINATION:  Physical Exam HENT:     Head: Normocephalic and atraumatic.  Eyes:     Conjunctiva/sclera: Conjunctivae normal.     Pupils: Pupils are equal, round, and reactive to light.  Neck:     Musculoskeletal: Normal range of motion and neck supple.     Thyroid: No thyromegaly.     Trachea: No tracheal deviation.  Cardiovascular:     Rate and Rhythm: Normal rate and regular rhythm.     Heart  sounds: Normal heart sounds.  Pulmonary:     Effort: Pulmonary effort is normal. No respiratory distress.     Breath sounds: Normal breath sounds. No wheezing.  Chest:     Chest wall: No tenderness.  Abdominal:     General: Bowel sounds are normal. There is no distension.     Palpations: Abdomen is soft.     Tenderness: There is no abdominal tenderness.  Musculoskeletal: Normal range of motion.  Skin:    General: Skin is warm and dry.     Findings: No rash.  Neurological:     Mental Status: He is alert and oriented to person, place, and time.     Cranial Nerves: No cranial nerve deficit.    LABORATORY PANEL:  Male CBC Recent Labs  Lab 05/21/19 0621  WBC 9.4  HGB 12.1*  HCT 35.4*  PLT 222   ------------------------------------------------------------------------------------------------------------------ Chemistries  Recent Labs  Lab 05/20/19 0656 05/21/19 0621  NA 134*  --   K 4.2  --   CL 101  --   CO2 23  --   GLUCOSE 98  --   BUN 17  --   CREATININE 0.82 0.95  CALCIUM 9.1  --    RADIOLOGY:  No results found. ASSESSMENT AND PLAN:   1.  Atrial fibrillation with RVR -  converted to oral Cardizem -rate is well controlled at this time - Telemetry monitoring -  Appreciate cardiology Dr. Ubaldo Glassing input - Results of echocardiogram on 7/26 reviewed with 45 to 50% ejection fraction - high risk for bleeding considering underlying dementia, holding off  long-term anticoagulation  2.  Urinary tract infection: Based on UA - Urine culture not having any growth. He is afebrile and not having no leukocytosis  Antibiotics have been stopped  3.  Hypertension - Zestoretic continued  4.  BPH - Tamsulosin continued  5.  Hypokalemia -Replaced PT recommends SNF.  Social worker working on placement -waiting on Tourist information centre manager for patient to go to [place] discussed this with patient's spouse.  Overall poor prognosis.  Await palliative  care consultation for goals of care he may be hospice/outpatient palliative care appropriate    All the records are reviewed and case discussed with Care Management/Social Worker. Management plans discussed with the patient, nursing and they are in agreement.  CODE STATUS: Full Code  TOTAL TIME TAKING CARE OF THIS PATIENT: 35 minutes.   More than 50% of the time was spent in counseling/coordination of care: YES  POSSIBLE D/C EITHER LATER TODAY OR WHEN INSURANCE AUTH OBTAINED   Kelvin Sennett M.D on 05/21/2019 at 2:02 PM  Between 7am to 6pm - Pager - [phone]  After 6pm go to [email or web address] - Technical brewer [place] [facility]  Office  [phone]  CC: Primary care physician; Kirk Ruths, MD  Note: This dictation was prepared with Dragon dictation along with smaller phrase technology. Any transcriptional errors that result from this process are unintentional.

## 2019-05-22 MED ORDER — DILTIAZEM HCL 30 MG PO TABS
60.0000 mg | ORAL_TABLET | Freq: Once | ORAL | Status: AC
  Administered 2019-05-22: 60 mg via ORAL
  Filled 2019-05-22: qty 2

## 2019-05-22 MED ORDER — DILTIAZEM HCL 25 MG/5ML IV SOLN
5.0000 mg | Freq: Once | INTRAVENOUS | Status: AC
  Administered 2019-05-22: 5 mg via INTRAVENOUS
  Filled 2019-05-22: qty 5

## 2019-05-22 MED ORDER — DILTIAZEM HCL ER COATED BEADS 120 MG PO CP24
120.0000 mg | ORAL_CAPSULE | Freq: Two times a day (BID) | ORAL | Status: DC
  Administered 2019-05-22 – 2019-05-23 (×2): 120 mg via ORAL
  Filled 2019-05-22 (×2): qty 1

## 2019-05-22 NOTE — Progress Notes (Signed)
Patient converted to AFIB RVR. HR 110-130s. MD Marcille Blanco notified. MD to place orders. Will continue to monitor.   Roberto Adams M

## 2019-05-22 NOTE — Plan of Care (Signed)

## 2019-05-22 NOTE — Progress Notes (Signed)
Physical Therapy Treatment Patient Details Name: Roberto Adams MRN: [id number] DOB: 10-09-1935 Today's Date: 05/22/2019    History of Present Illness 84yo M who came to [facility] on 8/3, presenting to the emergency room from home with palpitations.  He denies experiencing chest pain or shortness of breath.  EKG on arrival to the emergency room demonstrated atrial fibrillation with RVR at a rate of 130 bpm. He was recent here, admitted 7/25 w/ progressive confusion, found in driveway by wife, he went home and is now back with palpitations and weakness. PMH: TIA c aphasia, dementia, CAD, Rt Lung CA s/p lobe resection, HTN, HOH. CT of neck and head negative for acute findings. head MRI/MRA shows no acute infarct, but does show chronic Right ICA occlusion.    PT Comments    Per chart review, RN notes pt converting back to AF c RVR this morning, rates in 110s, author notes 110-120s prior to entry. Pt found awake, lying at 45 degree angle with one leg OOB, breakfast in room but untouched. Pt is confused this morning, follows some cues, but with additional time needed. When asked to state his name 5x, pt responds each time by saying "when were you born?" Pt assisted to EOB to observe cardiac tolerance to activity, noted rates increasing to 130s-145bpm, when it was decided session should be ended. Pt assisted back to center of bed needed +2 Total assist, then sat up tall and breakfast is set up. Pt given some potatoes, which he eats, but thereafter continues to show no interest in eating. Session ended, MD made aware of worsening RVR rates with bed activity. Pt much weaker this date than last admission, and more confused this date. Still warrants STR level care for full return to PLOF prior to return to home.     Follow Up Recommendations  SNF     Equipment Recommendations  None recommended by PT(to be determined by facility)    Recommendations for Other Services       Precautions / Restrictions  Precautions Precautions: Fall Restrictions Weight Bearing Restrictions: No    Mobility  Bed Mobility Overal bed mobility: Needs Assistance Bed Mobility: Supine to Sit;Sit to Supine     Supine to sit: +2 for physical assistance;Max assist Sit to supine: +2 for physical assistance;Total assist   General bed mobility comments: movign slow, poor motor planning, more confused, quite weak. HR increased to 130s-145bpm in RVR/AF, appears to slow after 2 minutes, but still widely variable 120s-140s.  Transfers Overall transfer level: Needs assistance Equipment used: 1 person hand held assist;None Transfers: Sit to/from Stand;Lateral/Scoot Transfers Sit to Stand: Total assist;+2 physical assistance        Lateral/Scoot Transfers: +2 physical assistance;Total assist General transfer comment: very weak, barely able to get buttocks off of bed, attempting to reposition to center of bed with draw sheet prior to returning to supine. Largely unsuccessful d/t weakness followed by anxiety.  Ambulation/Gait Ambulation/Gait assistance: (not approprate or safe to attempt at this time,)               Stairs             Wheelchair Mobility    Modified Rankin (Stroke Patients Only)       Balance Overall balance assessment: Needs assistance Sitting-balance support: Single extremity supported;Feet supported Sitting balance-Leahy Scale: Good Sitting balance - Comments: needs additional time to establish balance.  Cognition Arousal/Alertness: Awake/alert Behavior During Therapy: Restless;Flat affect;Impulsive(confused this morning, not responding appropriately to questioning.)                                          Exercises      General Comments        Pertinent Vitals/Pain Pain Assessment: No/denies pain    Home Living                      Prior Function            PT Goals (current  goals can now be found in the care plan section) Acute Rehab PT Goals Patient Stated Goal: get stronger PT Goal Formulation: With patient Time For Goal Achievement: 05/22/19 Potential to Achieve Goals: Good Progress towards PT goals: Not progressing toward goals - comment    Frequency    Min 2X/week      PT Plan Current plan remains appropriate    Co-evaluation              AM-PAC PT "6 Clicks" Mobility   Outcome Measure  Help needed turning from your back to your side while in a flat bed without using bedrails?: Total Help needed moving from lying on your back to sitting on the side of a flat bed without using bedrails?: Total Help needed moving to and from a bed to a chair (including a wheelchair)?: Total Help needed standing up from a chair using your arms (e.g., wheelchair or bedside chair)?: Total Help needed to walk in hospital room?: Total Help needed climbing 3-5 steps with a railing? : Total 6 Click Score: 6    End of Session   Activity Tolerance: Treatment limited secondary to medical complications (Comment);Other (comment)(RVR and cognition quite limiting this date) Patient left: in bed;with bed alarm set;with call bell/phone within reach   PT Visit Diagnosis: Unsteadiness on feet (R26.81);Difficulty in walking, not elsewhere classified (R26.2);Muscle weakness (generalized) (M62.81)     Time: 0912-0926 PT Time Calculation (min) (ACUTE ONLY): 14 min  Charges:  $Therapeutic Activity: 8-22 mins                     9:44 AM, 05/22/19 Etta Grandchild, PT, DPT Physical Therapist - [facility]  [phone] ([place])    [place] Scheiderer C 05/22/2019, 9:37 AM

## 2019-05-22 NOTE — Progress Notes (Signed)
[facility] [place] NAME: Roberto Adams    MR#:  [id number]  DATE OF BIRTH:  [birth year]  SUBJECTIVE:  CHIEF COMPLAINT:   Chief Complaint  Patient presents with  . Tachycardia  Patient received physical therapy With an outpatient got physical therapy his heart rate went up Heart rate went up to 140 bpm No complaints of chest REVIEW OF SYSTEMS:  Review of Systems  Constitutional: Negative for diaphoresis, fever, malaise/fatigue and weight loss.  HENT: Negative for ear discharge, ear pain, hearing loss, nosebleeds, sore throat and tinnitus.   Eyes: Negative for blurred vision and pain.  Respiratory: Positive for cough. Negative for hemoptysis, shortness of breath and wheezing.   Cardiovascular: Negative for chest pain, palpitations, orthopnea and leg swelling.  Gastrointestinal: Negative for abdominal pain, blood in stool, constipation, diarrhea, heartburn, nausea and vomiting.  Genitourinary: Negative for dysuria, frequency and urgency.  Musculoskeletal: Negative for back pain and myalgias.  Skin: Negative for itching and rash.  Neurological: Negative for dizziness, tingling, tremors, focal weakness, seizures, weakness and headaches.  Psychiatric/Behavioral: Negative for depression. The patient is not nervous/anxious.    DRUG ALLERGIES:   Allergies  Allergen Reactions  . Statins Other (See Comments)    Other reaction(s): Muscle Pain Other reaction(s): Muscle Pain   VITALS:  Blood pressure 112/87, pulse (!) 145, temperature 98.2 F (36.8 C), temperature source Oral, resp. rate 16, height 5\' 6"  (1.676 m), weight 78.4 kg, SpO2 93 %. PHYSICAL EXAMINATION:  Physical Exam HENT:     Head: Normocephalic and atraumatic.  Eyes:     Conjunctiva/sclera: Conjunctivae normal.     Pupils: Pupils are equal, round, and reactive to light.  Neck:     Musculoskeletal: Normal range of motion and neck supple.     Thyroid: No thyromegaly.      Trachea: No tracheal deviation.  Cardiovascular:     Rate and Rhythm: Normal rate and regular rhythm.     Heart sounds: Normal heart sounds.  Pulmonary:     Effort: Pulmonary effort is normal. No respiratory distress.     Breath sounds: Normal breath sounds. No wheezing.  Chest:     Chest wall: No tenderness.  Abdominal:     General: Bowel sounds are normal. There is no distension.     Palpations: Abdomen is soft.     Tenderness: There is no abdominal tenderness.  Musculoskeletal: Normal range of motion.  Skin:    General: Skin is warm and dry.     Findings: No rash.  Neurological:     Mental Status: He is alert and oriented to person, place, and time.     Cranial Nerves: No cranial nerve deficit.    LABORATORY PANEL:  Male CBC Recent Labs  Lab 05/21/19 0621  WBC 9.4  HGB 12.1*  HCT 35.4*  PLT 222   ------------------------------------------------------------------------------------------------------------------ Chemistries  Recent Labs  Lab 05/20/19 0656 05/21/19 0621  NA 134*  --   K 4.2  --   CL 101  --   CO2 23  --   GLUCOSE 98  --   BUN 17  --   CREATININE 0.82 0.95  CALCIUM 9.1  --    RADIOLOGY:  No results found. ASSESSMENT AND PLAN:   1.  Atrial fibrillation with RVR -  With give one dose of oral Cardizem 60 mg now -Increase Cardizem dose to 120 mg twice daily orally - Telemetry monitoring -  Appreciate cardiology Dr. Ubaldo Glassing input - Results of echocardiogram on 7/26 reviewed with 45 to 50% ejection fraction - high risk for bleeding considering underlying dementia, holding off long-term anticoagulation  2.  Urinary tract infection: Based on UA - Urine culture not having any growth. He is afebrile and not having no leukocytosis  Antibiotics have been stopped  3.  Hypertension - Zestoretic continued  4.  BPH - Tamsulosin continued  5.  Hypokalemia -Replaced PT recommends SNF.  Social worker working on placement -waiting on Nurse, mental health for patient to go to [employer] [facility] updated  Overall poor prognosis.  Await palliative care consultation for goals of care he may be hospice/outpatient palliative care appropriate    All the records are reviewed and case discussed with Care Management/Social Worker. Management plans discussed with the patient, nursing and they are in agreement.  CODE STATUS: Full Code  TOTAL TIME TAKING CARE OF THIS PATIENT: 36 minutes.   More than 50% of the time was spent in counseling/coordination of care: YES  POSSIBLE D/C EITHER LATER TODAY OR WHEN INSURANCE AUTH OBTAINED   Roberto Adams M.D on 05/22/2019 at 10:08 AM  Between 7am to 6pm - Pager - [phone]  After 6pm go to [email or web address] - Technical brewer [facility]  Office  [phone]  CC: Primary care physician; Kirk Ruths, MD  Note: This dictation was prepared with Dragon dictation along with smaller phrase technology. Any transcriptional errors that result from this process are unintentional.

## 2019-05-23 LAB — SARS CORONAVIRUS 2 (TAT 6-24 HRS): SARS Coronavirus 2: NEGATIVE

## 2019-05-23 MED ORDER — DILTIAZEM HCL ER COATED BEADS 120 MG PO CP24: 120.0000 mg | ORAL_CAPSULE | Freq: Two times a day (BID) | ORAL | 0 refills | Status: AC

## 2019-05-23 NOTE — Progress Notes (Signed)
New referral for outpatient Palliative to follow at [facility] received from [employer]. Plan is for discharge today. Patient information faxed to referral. Flo Shanks BSN, RN, [place] [phone]

## 2019-05-23 NOTE — Care Management Important Message (Signed)
Important Message  Patient Details  Name: Roberto Adams MRN: [id number] Date of Birth: Apr 13, 1935   Medicare Important Message Given:  Yes     Juliann Pulse A Drea Jurewicz 05/23/2019, 10:45 AM

## 2019-05-23 NOTE — Progress Notes (Signed)
Palliative:  Please see palliative consult completed 05/20/19 with recommendations to continue [facility] discussion with outpatient palliative care to follow at SNF. Not eligible for hospice if pursuing rehab attempt regardless of prognosis.   No charge  Vinie Sill, NP Palliative Medicine Team Pager [phone] (Please see [email or web address] for schedule) Team Phone [phone]

## 2019-05-23 NOTE — TOC Transition Note (Addendum)
Transition of Care [facility]) - CM/SW Discharge Note   Patient Details  Name: Roberto Adams MRN: [id number] Date of Birth: [birth year]  [facility] CM/SW Contact:  Ross Ludwig, LCSW Phone Number: 05/23/2019, 12:41 PM   Clinical Narrative:     CSW spoke with [name] who confirmed that patient has been approved for SNF.  CSW spoke to [facility] they are able to accept patient today.  CSW was informed by palliative that family would like palliative to follow patient at SNF.  CSW made referral to Flo Shanks at [facility]  CSW attempted to contact patient's wife, and CSW had to leave a message on voice mail for patient's wife.  Updated Patient's wife called CSW and informed her that she is aware that patient is discharging today.      Final next level of care: Skilled Nursing Facility Barriers to Discharge: Barriers Resolved   Patient Goals and CMS Choice Patient states their goals for this hospitalization and ongoing recovery are:: To go to SNF for short term rehab, then hopefully return back home with home health. CMS Medicare.gov Compare Post Acute Care list provided to:: Patient Represenative (must comment) Choice offered to / list presented to : Spouse  Discharge Placement  Patient to be d/c'ed today to [facility] room 509Patient and family agreeable to plans will transport via ems RN to call report to [phone].  CSW left message on wife's voice mail.    PASRR number recieved: 05/19/19            Patient chooses bed at: [facility] Patient to be transferred to facility by: [facility] EMS Name of family member notified: Patient's wife Vaughan Basta is aware of patient discharging today. Patient and family notified of of transfer: 05/23/19  Discharge Plan and Services                                     Social Determinants of Health (SDOH) Interventions     Readmission Risk Interventions No flowsheet  data found.

## 2019-05-23 NOTE — Progress Notes (Signed)
Called report to [place] at [facility].  D/c telemetry and piv.  Will be escorted out of hospital via EMS.

## 2019-05-23 NOTE — Progress Notes (Signed)
EMS is here to pick up patient, previous RN Amy removed PIV and telemetry monitor. Called patient's daughter about the transport.

## 2019-05-23 NOTE — Plan of Care (Signed)
?  Problem: Clinical Measurements: ?Goal: Will remain free from infection ?Outcome: Progressing ?Goal: Diagnostic test results will improve ?Outcome: Progressing ?Goal: Respiratory complications will improve ?Outcome: Progressing ?  ?

## 2019-05-23 NOTE — Discharge Summary (Addendum)
[facility] [place] NAME: Roberto Adams    MR#:  [id number]  DATE OF BIRTH:  [birth year]  DATE OF ADMISSION:  05/16/2019   ADMITTING PHYSICIAN: Gwynneth Aliment, MD  DATE OF DISCHARGE: 05/23/2019  PRIMARY CARE PHYSICIAN: Kirk Ruths, MD   ADMISSION DIAGNOSIS:  Tachycardia [R00.0] Atrial fibrillation with RVR ([place]) [I48.91] DISCHARGE DIAGNOSIS:  Active Problems:   Atrial fibrillation with rapid ventricular response (HCC)  SECONDARY DIAGNOSIS:   Past Medical History:  Diagnosis Date  . Amnesia, global, transient 08/20/2015   Overview:  Better at [facility], mri negative. On lipitor and plavix. Only issue is insomnia now.    . Arthritis   . Benign prostatic hyperplasia with urinary obstruction 11/06/2012  . CAD (coronary artery disease)   . Cancer of right lung ([place])    Right Upper lobe resection.   . Cancer of skin, squamous cell   . Elevated prostate specific antigen (PSA) 07/05/2012  . Enlarged prostate   . Hearing aid worn    both ears  . History of lung cancer 2001   Rt upper lobe; s/p chemo and radiation with recurrence  . HLD (hyperlipidemia)   . Hypertension   . Lumbar canal stenosis 01/17/2013  . Mild cognitive disorder 07/22/2014   Last Assessment & Plan:  Memory seems to be stable   . Nocturia associated with benign prostatic hypertrophy   . Pure hypercholesterolemia 07/22/2014   Last Assessment & Plan:  Has been working on a low fat diet and no myalgia's are present.    Marland Kitchen Spinal stenosis, lumbar region, with neurogenic claudication 01/17/2013  . TIA (transient ischemic attack) 08/08/2015  . Vasomotor rhinitis 08/08/2014   HOSPITAL COURSE:   1.Atrial fibrillation with RVR - converted to oral Cardizem -rate is well controlled at this time -Echocardiogram on 7/26 with 45 to 50% ejection fraction - high risk for bleeding considering underlying dementia, holding off long-term anticoagulation per cardio  2. Urinary  tract infection: treated. Urine c/s with no growth  3. Hypertension -Zestoretic   4. BPH -Tamsulosin   5.  Hypokalemia -Repleted DISCHARGE CONDITIONS:  stable CONSULTS OBTAINED:  Treatment Team:  Teodoro Spray, MD DRUG ALLERGIES:   Allergies  Allergen Reactions  . Statins Other (See Comments)    Other reaction(s): Muscle Pain Other reaction(s): Muscle Pain   DISCHARGE MEDICATIONS:   Allergies as of 05/23/2019      Reactions   Statins Other (See Comments)   Other reaction(s): Muscle Pain Other reaction(s): Muscle Pain      Medication List    TAKE these medications   aspirin 81 MG tablet Take 81 mg by mouth daily.   clopidogrel 75 MG tablet Commonly known as: Plavix Take 1 tablet (75 mg total) by mouth daily.   diltiazem 120 MG 24 hr capsule Commonly known as: CARDIZEM CD Take 1 capsule (120 mg total) by mouth 2 (two) times daily.   finasteride 5 MG tablet Commonly known as: PROSCAR Take 1 tablet by mouth once daily   lisinopril-hydrochlorothiazide 10-12.5 MG tablet Commonly known as: ZESTORETIC Take 1 tablet by mouth daily.   tamsulosin 0.4 MG Caps capsule Commonly known as: FLOMAX Take 1 capsule (0.4 mg total) by mouth daily.   vitamin B-12 1000 MCG tablet Commonly known as: CYANOCOBALAMIN Take 1,000 mcg by mouth daily.   Vitamin D-1000 Max St 25 MCG (1000 UT) tablet Generic drug: Cholecalciferol Take 1,000 mg by mouth daily.  DISCHARGE INSTRUCTIONS:   DIET:  Cardiac diet DISCHARGE CONDITION:  Stable ACTIVITY:  Activity as tolerated OXYGEN:  Home Oxygen: No.  Oxygen Delivery: room air DISCHARGE LOCATION:  nursing home   If you experience worsening of your admission symptoms, develop shortness of breath, life threatening emergency, suicidal or homicidal thoughts you must seek medical attention immediately by calling 911 or calling your MD immediately  if symptoms less severe.  You Must read complete  instructions/literature along with all the possible adverse reactions/side effects for all the Medicines you take and that have been prescribed to you. Take any new Medicines after you have completely understood and accpet all the possible adverse reactions/side effects.   Please note  You were cared for by a hospitalist during your hospital stay. If you have any questions about your discharge medications or the care you received while you were in the hospital after you are discharged, you can call the unit and asked to speak with the hospitalist on call if the hospitalist that took care of you is not available. Once you are discharged, your primary care physician will handle any further medical issues. Please note that NO REFILLS for any discharge medications will be authorized once you are discharged, as it is imperative that you return to your primary care physician (or establish a relationship with a primary care physician if you do not have one) for your aftercare needs so that they can reassess your need for medications and monitor your lab values.    On the day of Discharge:  VITAL SIGNS:  Blood pressure 109/76, pulse (!) 108, temperature 98.5 F (36.9 C), temperature source Oral, resp. rate 19, height 5\' 6"  (1.676 m), weight 77.2 kg, SpO2 97 %. PHYSICAL EXAMINATION:  GENERAL:  84 y.o.-year-old patient lying in the bed with no acute distress.  EYES: Pupils equal, round, reactive to light and accommodation. No scleral icterus. Extraocular muscles intact.  HEENT: Head atraumatic, normocephalic. Oropharynx and nasopharynx clear.  NECK:  Supple, no jugular venous distention. No thyroid enlargement, no tenderness.  LUNGS: Normal breath sounds bilaterally, no wheezing, rales,rhonchi or crepitation. No use of accessory muscles of respiration.  CARDIOVASCULAR: S1, S2 normal. No murmurs, rubs, or gallops.  ABDOMEN: Soft, non-tender, non-distended. Bowel sounds present. No organomegaly or mass.   EXTREMITIES: No pedal edema, cyanosis, or clubbing.  NEUROLOGIC: Cranial nerves II through XII are intact. Muscle strength 5/5 in all extremities. Sensation intact. Gait not checked.  PSYCHIATRIC: The patient is alert and oriented x 3.  SKIN: No obvious rash, lesion, or ulcer.  DATA REVIEW:   CBC Recent Labs  Lab 05/21/19 0621  WBC 9.4  HGB 12.1*  HCT 35.4*  PLT 222    Chemistries  Recent Labs  Lab 05/20/19 0656 05/21/19 0621  NA 134*  --   K 4.2  --   CL 101  --   CO2 23  --   GLUCOSE 98  --   BUN 17  --   CREATININE 0.82 0.95  CALCIUM 9.1  --       Contact information for follow-up providers    Kirk Ruths, MD. Schedule an appointment as soon as possible for a visit in 1 week(s).   Specialty: Internal Medicine Contact information: [place] [address] [phone]        Teodoro Spray, MD. Schedule an appointment as soon as possible for a visit in 1 week.   Specialty: Cardiology Why:  OFFICE IS CLOSED. NEED TO CALL FOR APPOINTMENT Contact information: [place] [address] [phone]            Contact information for after-discharge care    [facility] .   Service: Skilled Nursing Contact information: [place] [phone]                  Management plans discussed with the patient, family and they are in agreement.  CODE STATUS: Full Code   TOTAL TIME TAKING CARE OF THIS PATIENT: 45 minutes.  Palliative care to follow up at the facility   Saundra Shelling M.D on 05/23/2019 at 10:00 AM  Between 7am to 6pm - Pager - [phone]  After 6pm go to [email or web address] - Technical brewer [facility]  Office  [phone]  CC: Primary care physician; Kirk Ruths, MD   Note: This dictation was prepared with Dragon dictation along with  smaller phrase technology. Any transcriptional errors that result from this process are unintentional.

## 2019-05-27 ENCOUNTER — Ambulatory Visit: Payer: Medicare Other | Admitting: Urology

## 2019-06-14 DEATH — deceased

## 2019-06-22 ENCOUNTER — Ambulatory Visit: Payer: Medicare Other | Admitting: Urology

## 2020-12-18 IMAGING — MR MRI HEAD WITHOUT CONTRAST
13 of 16 series · 35 of 48 positions shown · non-contrast
Comparison: MRI brain CT head and CTA head 05/07/2019. MRI brain
08/09/2015

CLINICAL DATA: Altered mental status.  Slurred speech.

EXAM:
MRI HEAD WITHOUT CONTRAST
TECHNIQUE: Multiplanar, multiecho pulse sequences of the brain and surrounding
structures were obtained without intravenous contrast.

[Series 5: ax dwi_tracew · axial · 3.0mm · 0.60mm/px · z∈[-106,+44]mm · 5 of 48 slices shown (1 of 2)]
[im 1/48]
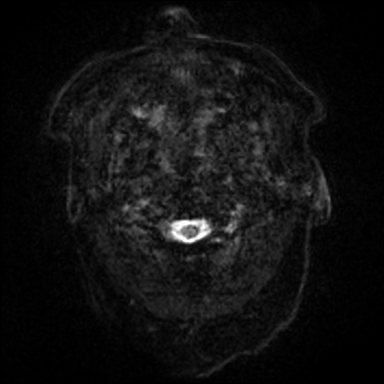
[im 12/48]
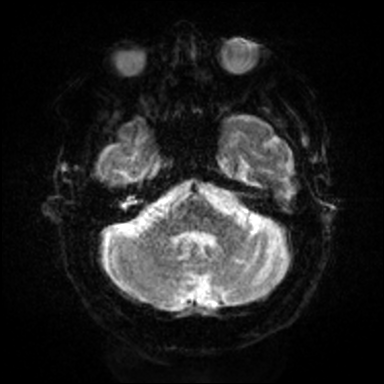
[im 24/48]
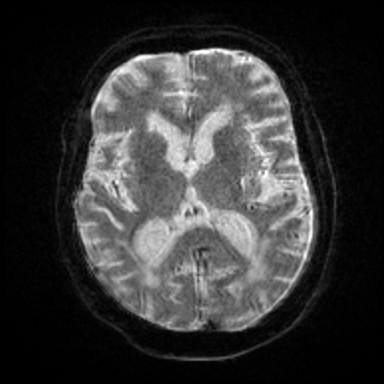
[im 36/48]
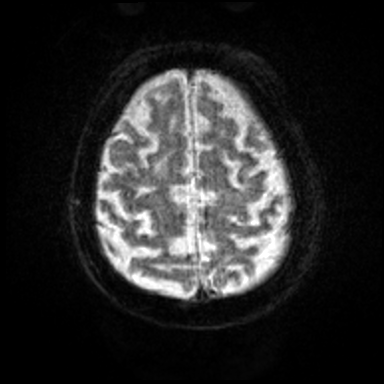
[im 48/48]
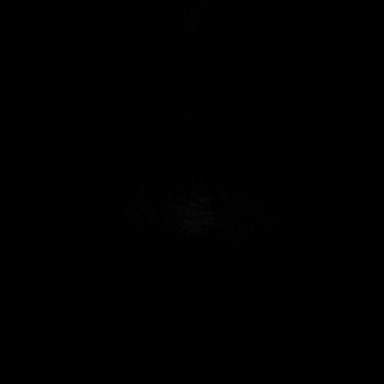

[Series 5: ax dwi_tracew · axial · 3.0mm · 0.60mm/px · z∈[-106,+44]mm · 4 of 48 slices shown (2 of 2)]
[im 1/48]
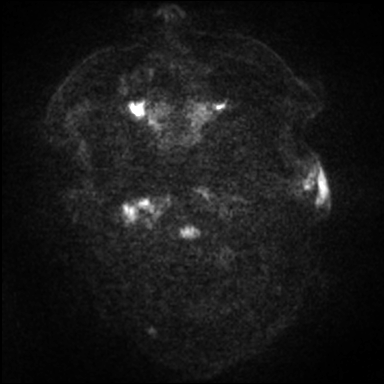
[im 16/48]
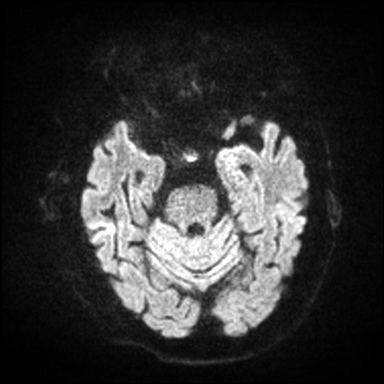
[im 32/48]
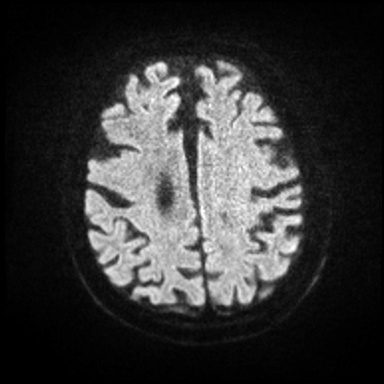
[im 48/48]
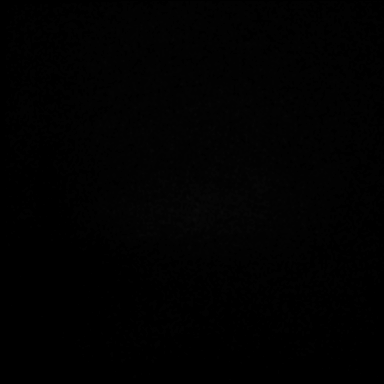

[Series 6: ax dwi_adc · axial · 3.0mm · 0.60mm/px · z∈[-106,+35]mm · 4 of 45 slices shown]
[im 1/45]
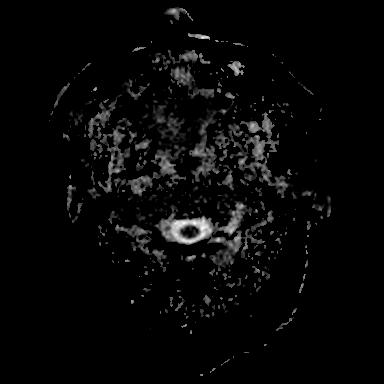
[im 15/45]
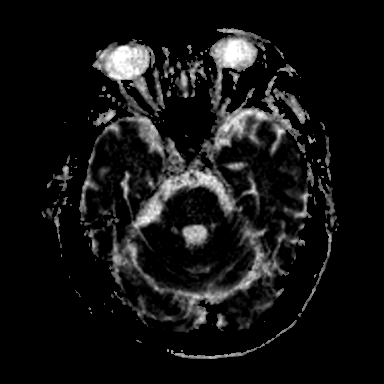
[im 30/45]
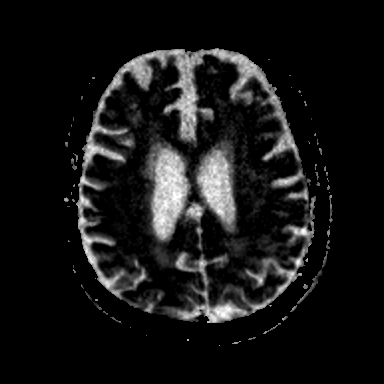
[im 45/45]
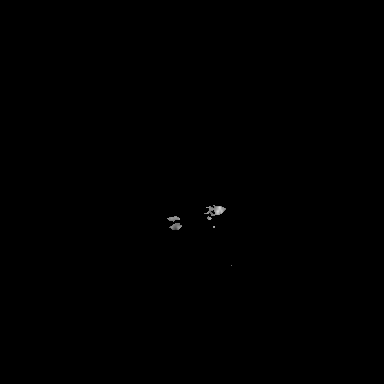

[Series 7: cor dwi_tracew · coronal · 5.0mm · 0.60mm/px · 3 of 38 slices shown (1 of 2)]
[im 1/38]
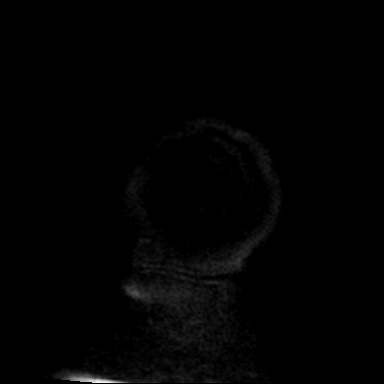
[im 19/38]
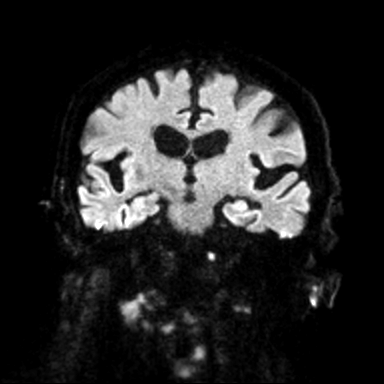
[im 38/38]
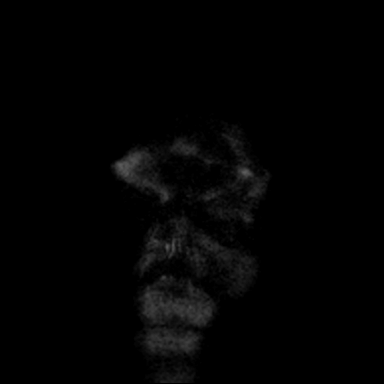

[Series 7: cor dwi_tracew · coronal · 5.0mm · 0.60mm/px · 3 of 38 slices shown (2 of 2)]
[im 1/38]
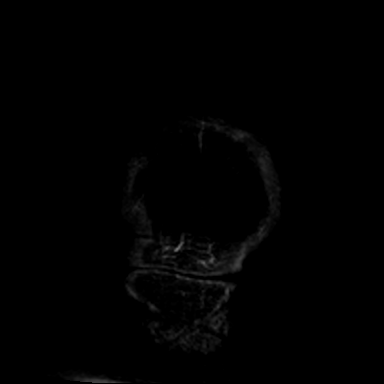
[im 19/38]
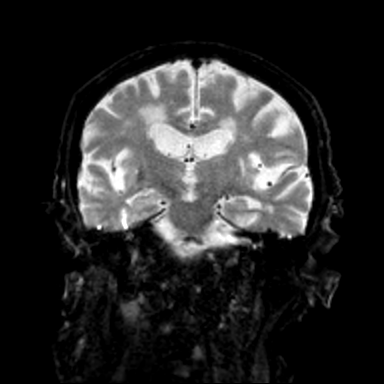
[im 38/38]
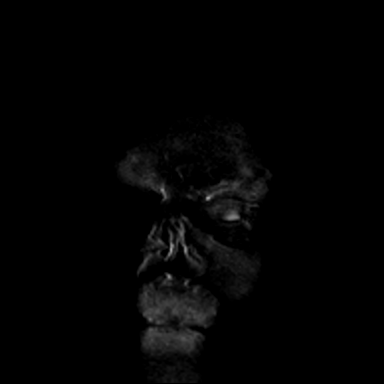

[Series 8: cor dwi_adc · coronal · 5.0mm · 0.60mm/px · 3 of 38 slices shown]
[im 1/38]
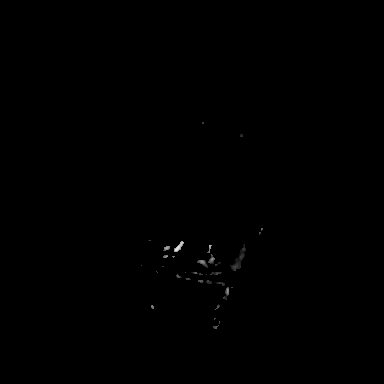
[im 19/38]
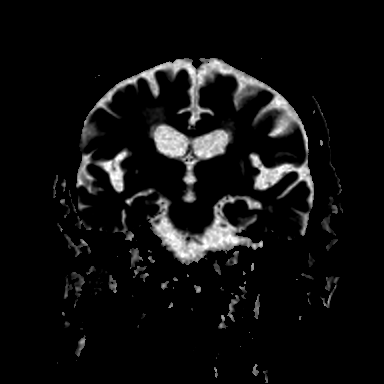
[im 38/38]
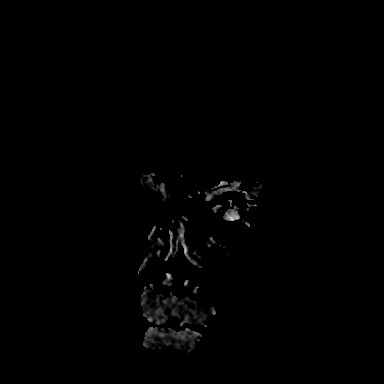

[Series 9: T1 · sagittal · 5.0mm · 0.94mm/px · 2 of 25 slices shown (1 of 3)]
[im 1/25]
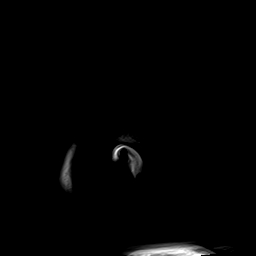
[im 25/25]
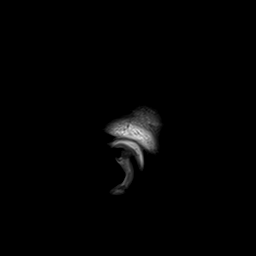

[Series 10: T2 · axial · 5.0mm · 0.45mm/px · z∈[-123,+40]mm · 2 of 29 slices shown (1 of 2)]
[im 1/29]
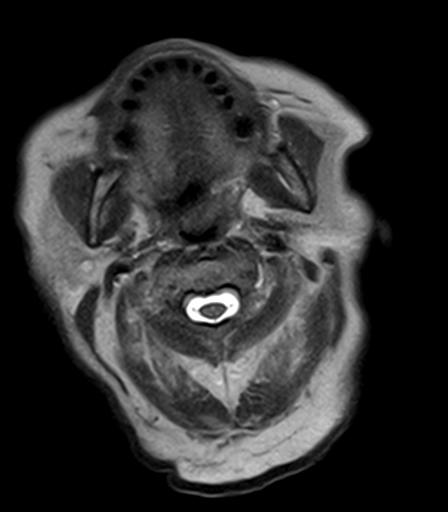
[im 29/29]
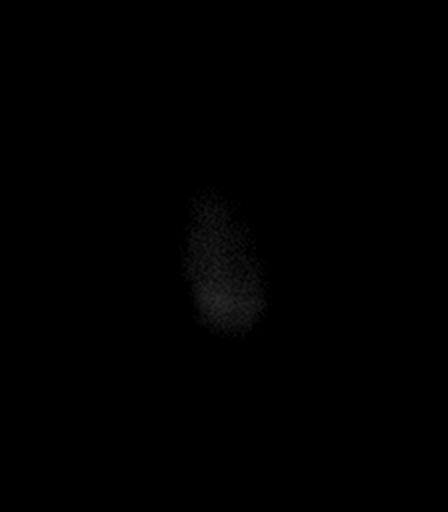

[Series 11: T2-star · axial · 5.0mm · 0.45mm/px · 1 of 27 slices shown]
[im 1/27]
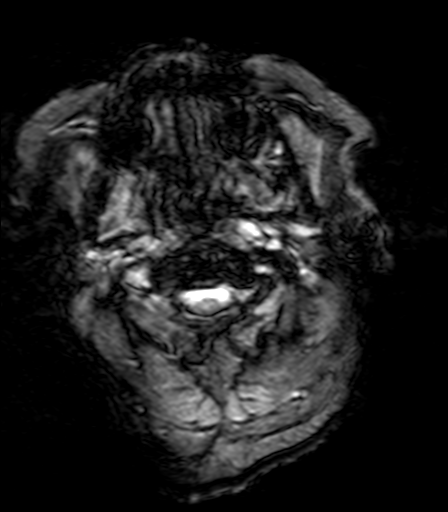

[Series 12: FLAIR · axial · 5.0mm · 1.20mm/px · z∈[-131,+32]mm · 2 of 29 slices shown]
[im 1/29]
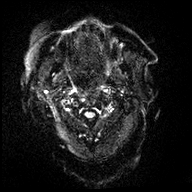
[im 29/29]
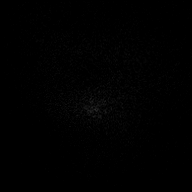

[Series 13: T1 · axial · 5.0mm · 0.90mm/px · z∈[-128,+35]mm · 2 of 29 slices shown (2 of 3)]
[im 1/29]
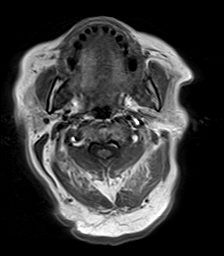
[im 29/29]
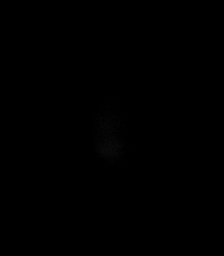

[Series 14: T2 · coronal · 5.0mm · 0.45mm/px · 2 of 32 slices shown (2 of 2)]
[im 1/32]
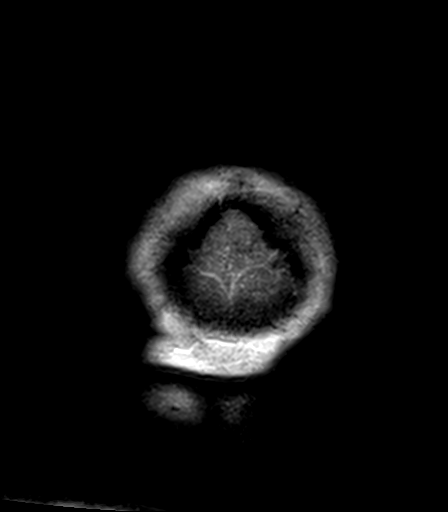
[im 32/32]
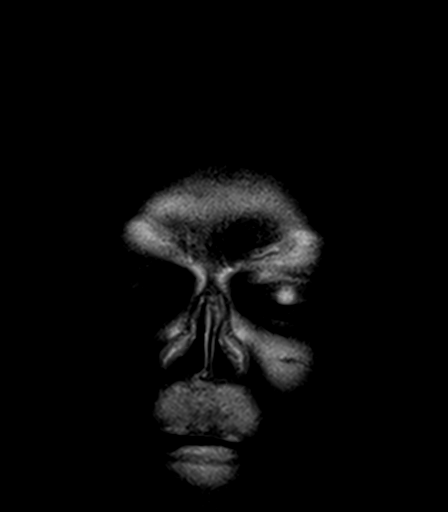

[Series 17: T1 · sagittal · 5.0mm · 0.94mm/px · 2 of 25 slices shown (3 of 3)]
[im 1/25]
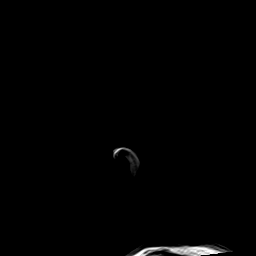
[im 25/25]
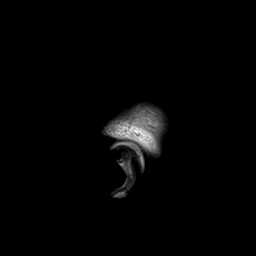

[35 of 48 positions shown; findings below may reference images not displayed]

FINDINGS: Brain: Advanced atrophy and diffuse white matter disease is stable.
No acute infarct, hemorrhage, or mass lesion is present. The
ventricles are proportionate to the degree of atrophy. A remote
lacunar infarct is present left lentiform nucleus. No significant
extraaxial fluid collection is present.

Vascular: The right internal carotid artery is chronically occluded
vessel is reconstituted at the level of the ophthalmic or posterior
communicating artery. Flow is present in the left internal carotid
artery and in the posterior circulation.

Skull and upper cervical spine: The craniocervical junction is
normal. Upper cervical spine is within normal limits. Marrow signal
is unremarkable.

Sinuses/Orbits: The paranasal sinuses and mastoid air cells are
clear. A left lens replacement is present. Globes and orbits are
otherwise within normal limits.
IMPRESSION: 1. Stable chronic atrophy and white matter disease.
2. Remote lacunar infarct of the left lentiform nucleus.
3. Chronic occlusion of the right internal carotid artery.
